# Patient Record
Sex: Female | Born: 1980 | Race: White | Hispanic: No | Marital: Married | Smoking: Never smoker
Health system: Southern US, Community
[De-identification: ages and names within clinical notes are randomized; demographics above are authoritative.]

## PROBLEM LIST (undated history)

## (undated) DIAGNOSIS — Z789 Other specified health status: Secondary | ICD-10-CM

## (undated) DIAGNOSIS — E041 Nontoxic single thyroid nodule: Secondary | ICD-10-CM

## (undated) HISTORY — PX: DENTAL SURGERY: SHX609

## (undated) HISTORY — PX: NO PAST SURGERIES: SHX2092

## (undated) HISTORY — PX: BIOPSY THYROID: PRO38

## (undated) HISTORY — PX: WISDOM TOOTH EXTRACTION: SHX21

## (undated) HISTORY — DX: Nontoxic single thyroid nodule: E04.1

---

## 2009-03-27 ENCOUNTER — Inpatient Hospital Stay (HOSPITAL_COMMUNITY): Admission: AD | Admit: 2009-03-27 | Discharge: 2009-03-30 | Payer: Self-pay | Admitting: Obstetrics and Gynecology

## 2010-03-27 ENCOUNTER — Emergency Department (HOSPITAL_COMMUNITY): Admission: EM | Admit: 2010-03-27 | Discharge: 2010-03-27 | Payer: Self-pay | Admitting: Emergency Medicine

## 2010-09-24 LAB — CBC
HCT: 25.8 % — ABNORMAL LOW (ref 36.0–46.0)
HCT: 32.8 % — ABNORMAL LOW (ref 36.0–46.0)
Hemoglobin: 11.3 g/dL — ABNORMAL LOW (ref 12.0–15.0)
Hemoglobin: 8.9 g/dL — ABNORMAL LOW (ref 12.0–15.0)
MCHC: 34.4 g/dL (ref 30.0–36.0)
MCHC: 34.4 g/dL (ref 30.0–36.0)
MCV: 88.3 fL (ref 78.0–100.0)
MCV: 89.7 fL (ref 78.0–100.0)
Platelets: 145 10*3/uL — ABNORMAL LOW (ref 150–400)
Platelets: 190 10*3/uL (ref 150–400)
RBC: 2.88 MIL/uL — ABNORMAL LOW (ref 3.87–5.11)
RBC: 3.72 MIL/uL — ABNORMAL LOW (ref 3.87–5.11)
RDW: 14.3 % (ref 11.5–15.5)
RDW: 14.3 % (ref 11.5–15.5)
WBC: 15.2 10*3/uL — ABNORMAL HIGH (ref 4.0–10.5)
WBC: 8.4 10*3/uL (ref 4.0–10.5)

## 2010-09-24 LAB — RPR: RPR Ser Ql: NONREACTIVE

## 2010-09-24 LAB — CCBB MATERNAL DONOR DRAW

## 2012-03-09 LAB — OB RESULTS CONSOLE HIV ANTIBODY (ROUTINE TESTING): HIV: NONREACTIVE

## 2012-03-09 LAB — OB RESULTS CONSOLE ABO/RH: RH Type: NEGATIVE

## 2012-03-09 LAB — OB RESULTS CONSOLE ANTIBODY SCREEN: Antibody Screen: NEGATIVE

## 2012-03-09 LAB — OB RESULTS CONSOLE GC/CHLAMYDIA: Gonorrhea: NEGATIVE

## 2012-03-09 LAB — OB RESULTS CONSOLE RPR: RPR: NONREACTIVE

## 2012-03-09 LAB — OB RESULTS CONSOLE HEPATITIS B SURFACE ANTIGEN: Hepatitis B Surface Ag: NEGATIVE

## 2012-06-21 NOTE — L&D Delivery Note (Signed)
After admission the pt rapidly progressed to C/C. She pushed x 2 and had a SVD of one live viable white female infant over an intact perineum. Placenta- S/I. EBL-400cc. Baby to NBN. Left labial tear closed with 3-0 Chromic.

## 2012-10-19 ENCOUNTER — Inpatient Hospital Stay (HOSPITAL_COMMUNITY)
Admission: AD | Admit: 2012-10-19 | Discharge: 2012-10-20 | DRG: 775 | Disposition: A | Discharge status: Home / Self Care | Payer: Managed Care, Other (non HMO) | Source: Ambulatory Visit | Attending: Obstetrics and Gynecology | Admitting: Obstetrics and Gynecology

## 2012-10-19 ENCOUNTER — Inpatient Hospital Stay (HOSPITAL_COMMUNITY): Payer: Managed Care, Other (non HMO) | Admitting: Anesthesiology

## 2012-10-19 ENCOUNTER — Encounter (HOSPITAL_COMMUNITY): Payer: Self-pay | Admitting: *Deleted

## 2012-10-19 ENCOUNTER — Encounter (HOSPITAL_COMMUNITY): Payer: Self-pay | Admitting: Anesthesiology

## 2012-10-19 HISTORY — DX: Other specified health status: Z78.9

## 2012-10-19 LAB — CBC
Hemoglobin: 12.5 g/dL (ref 12.0–15.0)
MCH: 30.9 pg (ref 26.0–34.0)
MCHC: 34.3 g/dL (ref 30.0–36.0)
MCV: 89.9 fL (ref 78.0–100.0)
Platelets: 176 10*3/uL (ref 150–400)
RBC: 4.05 MIL/uL (ref 3.87–5.11)
RDW: 13.9 % (ref 11.5–15.5)
WBC: 12.8 10*3/uL — ABNORMAL HIGH (ref 4.0–10.5)

## 2012-10-19 LAB — ABO/RH: ABO/RH(D): A NEG

## 2012-10-19 LAB — RPR: RPR Ser Ql: NONREACTIVE

## 2012-10-19 MED ORDER — DIBUCAINE 1 % RE OINT: 1.0000 "application " | TOPICAL_OINTMENT | RECTAL | Status: DC | PRN

## 2012-10-19 MED ORDER — ACETAMINOPHEN 325 MG PO TABS: 650.0000 mg | ORAL_TABLET | ORAL | Status: DC | PRN

## 2012-10-19 MED ORDER — FENTANYL 2.5 MCG/ML BUPIVACAINE 1/10 % EPIDURAL INFUSION (WH - ANES)
INTRAMUSCULAR | Status: DC | PRN
  Administered 2012-10-19: 14 mL/h via EPIDURAL

## 2012-10-19 MED ORDER — IBUPROFEN 600 MG PO TABS
600.0000 mg | ORAL_TABLET | Freq: Four times a day (QID) | ORAL | Status: DC | PRN
  Filled 2012-10-19 (×3): qty 1

## 2012-10-19 MED ORDER — PRENATAL MULTIVITAMIN CH
1.0000 | ORAL_TABLET | Freq: Every day | ORAL | Status: DC
  Administered 2012-10-19 – 2012-10-20 (×2): 1 via ORAL
  Filled 2012-10-19 (×2): qty 1

## 2012-10-19 MED ORDER — ONDANSETRON HCL 4 MG/2ML IJ SOLN: 4.0000 mg | INTRAMUSCULAR | Status: DC | PRN

## 2012-10-19 MED ORDER — SIMETHICONE 80 MG PO CHEW: 80.0000 mg | CHEWABLE_TABLET | ORAL | Status: DC | PRN

## 2012-10-19 MED ORDER — WITCH HAZEL-GLYCERIN EX PADS: 1.0000 "application " | MEDICATED_PAD | CUTANEOUS | Status: DC | PRN

## 2012-10-19 MED ORDER — ZOLPIDEM TARTRATE 5 MG PO TABS: 5.0000 mg | ORAL_TABLET | Freq: Every evening | ORAL | Status: DC | PRN

## 2012-10-19 MED ORDER — EPHEDRINE 5 MG/ML INJ
10.0000 mg | INTRAVENOUS | Status: DC | PRN
  Filled 2012-10-19: qty 4
  Filled 2012-10-19: qty 2

## 2012-10-19 MED ORDER — FLEET ENEMA 7-19 GM/118ML RE ENEM: 1.0000 | ENEMA | Freq: Once | RECTAL | Status: DC

## 2012-10-19 MED ORDER — LIDOCAINE HCL (PF) 1 % IJ SOLN
30.0000 mL | INTRAMUSCULAR | Status: DC | PRN
  Filled 2012-10-19 (×2): qty 30

## 2012-10-19 MED ORDER — OXYTOCIN 40 UNITS IN LACTATED RINGERS INFUSION - SIMPLE MED
62.5000 mL/h | INTRAVENOUS | Status: DC
  Administered 2012-10-19: 62.5 mL/h via INTRAVENOUS
  Filled 2012-10-19: qty 1000

## 2012-10-19 MED ORDER — TETANUS-DIPHTH-ACELL PERTUSSIS 5-2.5-18.5 LF-MCG/0.5 IM SUSP
0.5000 mL | Freq: Once | INTRAMUSCULAR | Status: AC
  Administered 2012-10-20: 0.5 mL via INTRAMUSCULAR

## 2012-10-19 MED ORDER — BENZOCAINE-MENTHOL 20-0.5 % EX AERO
1.0000 "application " | INHALATION_SPRAY | CUTANEOUS | Status: DC | PRN
  Administered 2012-10-19: 1 via TOPICAL
  Filled 2012-10-19: qty 56

## 2012-10-19 MED ORDER — ONDANSETRON HCL 4 MG PO TABS: 4.0000 mg | ORAL_TABLET | ORAL | Status: DC | PRN

## 2012-10-19 MED ORDER — OXYCODONE-ACETAMINOPHEN 5-325 MG PO TABS: 1.0000 | ORAL_TABLET | ORAL | Status: DC | PRN

## 2012-10-19 MED ORDER — DIPHENHYDRAMINE HCL 50 MG/ML IJ SOLN: 12.5000 mg | INTRAMUSCULAR | Status: DC | PRN

## 2012-10-19 MED ORDER — LACTATED RINGERS IV SOLN
INTRAVENOUS | Status: DC
  Administered 2012-10-19: 08:00:00 via INTRAVENOUS

## 2012-10-19 MED ORDER — FENTANYL 2.5 MCG/ML BUPIVACAINE 1/10 % EPIDURAL INFUSION (WH - ANES)
14.0000 mL/h | INTRAMUSCULAR | Status: DC | PRN
  Filled 2012-10-19: qty 125

## 2012-10-19 MED ORDER — LIDOCAINE HCL (PF) 1 % IJ SOLN
INTRAMUSCULAR | Status: DC | PRN
  Administered 2012-10-19: 4 mL
  Administered 2012-10-19: 5 mL
  Administered 2012-10-19: 4 mL
  Administered 2012-10-19: 5 mL

## 2012-10-19 MED ORDER — LACTATED RINGERS IV SOLN: 500.0000 mL | Freq: Once | INTRAVENOUS | Status: DC

## 2012-10-19 MED ORDER — EPHEDRINE 5 MG/ML INJ
10.0000 mg | INTRAVENOUS | Status: DC | PRN
  Filled 2012-10-19: qty 2

## 2012-10-19 MED ORDER — PHENYLEPHRINE 40 MCG/ML (10ML) SYRINGE FOR IV PUSH (FOR BLOOD PRESSURE SUPPORT)
80.0000 ug | PREFILLED_SYRINGE | INTRAVENOUS | Status: DC | PRN
  Filled 2012-10-19: qty 5
  Filled 2012-10-19: qty 2

## 2012-10-19 MED ORDER — PHENYLEPHRINE 40 MCG/ML (10ML) SYRINGE FOR IV PUSH (FOR BLOOD PRESSURE SUPPORT)
80.0000 ug | PREFILLED_SYRINGE | INTRAVENOUS | Status: DC | PRN
  Filled 2012-10-19: qty 2

## 2012-10-19 MED ORDER — MEASLES, MUMPS & RUBELLA VAC ~~LOC~~ INJ
0.5000 mL | INJECTION | Freq: Once | SUBCUTANEOUS | Status: DC
  Filled 2012-10-19: qty 0.5

## 2012-10-19 MED ORDER — CITRIC ACID-SODIUM CITRATE 334-500 MG/5ML PO SOLN: 30.0000 mL | ORAL | Status: DC | PRN

## 2012-10-19 MED ORDER — OXYTOCIN BOLUS FROM INFUSION: 500.0000 mL | INTRAVENOUS | Status: DC

## 2012-10-19 MED ORDER — IBUPROFEN 600 MG PO TABS
600.0000 mg | ORAL_TABLET | Freq: Four times a day (QID) | ORAL | Status: DC
  Administered 2012-10-19 – 2012-10-20 (×5): 600 mg via ORAL
  Filled 2012-10-19 (×2): qty 1

## 2012-10-19 MED ORDER — ONDANSETRON HCL 4 MG/2ML IJ SOLN: 4.0000 mg | Freq: Four times a day (QID) | INTRAMUSCULAR | Status: DC | PRN

## 2012-10-19 MED ORDER — LACTATED RINGERS IV SOLN: 500.0000 mL | INTRAVENOUS | Status: DC | PRN

## 2012-10-19 NOTE — H&P (Signed)
Pt is a 31 year old white female, G2P1001 at term who presents to the ER with contractions. On admission the pt's cx was 90/4/-2 vtx. PNC was uncomplicated. GBS- RH-. PMHX: see [facility] PE: VSSAF        HEENT- wnl        ABD- gravid, non tender        FHTS- reactive IMP/ IUP in labor PLAN/ admit

## 2012-10-19 NOTE — Anesthesia Postprocedure Evaluation (Signed)
  Anesthesia Post-op Note  Patient: Shari Farmer  Procedure(s) Performed: * No procedures listed *  Patient Location: PACU and Mother/Baby  Anesthesia Type:Epidural  Level of Consciousness: awake, alert  and oriented  Airway and Oxygen Therapy: Patient Spontanous Breathing  Post-op Pain: mild  Post-op Assessment: Patient's Cardiovascular Status Stable, Respiratory Function Stable, No signs of Nausea or vomiting, Adequate PO intake, Pain level controlled, No headache, No backache, No residual numbness and No residual motor weakness  Post-op Vital Signs: stable  Complications: No apparent anesthesia complications

## 2012-10-19 NOTE — Anesthesia Preprocedure Evaluation (Signed)
Anesthesia Evaluation  Patient identified by MRN, date of birth, ID band Patient awake    Reviewed: Allergy & Precautions, H&P , Patient's Chart, lab work & pertinent test results  Airway Mallampati: III TM Distance: >3 FB Neck ROM: Full    Dental no notable dental hx. (+) Teeth Intact   Pulmonary neg pulmonary ROS,  breath sounds clear to auscultation  Pulmonary exam normal       Cardiovascular negative cardio ROS  Rhythm:Regular Rate:Normal     Neuro/Psych negative neurological ROS  negative psych ROS   GI/Hepatic negative GI ROS, Neg liver ROS,   Endo/Other  negative endocrine ROS  Renal/GU negative Renal ROS  negative genitourinary   Musculoskeletal negative musculoskeletal ROS (+)   Abdominal (+) + obese,   Peds  Hematology negative hematology ROS (+)   Anesthesia Other Findings   Reproductive/Obstetrics (+) Pregnancy                           Anesthesia Physical Anesthesia Plan  ASA: II  Anesthesia Plan: Epidural   Post-op Pain Management:    Induction:   Airway Management Planned: Natural Airway  Additional Equipment:   Intra-op Plan:   Post-operative Plan:   Informed Consent: I have reviewed the patients History and Physical, chart, labs and discussed the procedure including the risks, benefits and alternatives for the proposed anesthesia with the patient or authorized representative who has indicated his/her understanding and acceptance.     Plan Discussed with: Anesthesiologist  Anesthesia Plan Comments:         Anesthesia Quick Evaluation

## 2012-10-19 NOTE — MAU Note (Signed)
Dr Almetta Lovely notified of pt status and states he is on the way in and willl not admit her until he comes in  And see her

## 2012-10-19 NOTE — Anesthesia Procedure Notes (Addendum)
Epidural Patient location during procedure: OB Start time: 10/19/2012 6:06 AM  Staffing Anesthesiologist: FOSTER, MICHAEL A. Performed by: anesthesiologist   Preanesthetic Checklist Completed: patient identified, site marked, surgical consent, pre-op evaluation, timeout performed, IV checked, risks and benefits discussed and monitors and equipment checked  Epidural Patient position: sitting Prep: site prepped and draped and DuraPrep Patient monitoring: continuous pulse ox and blood pressure Approach: midline Injection technique: LOR air  Needle:  Needle type: Tuohy  Needle gauge: 17 G Needle length: 9 cm and 9 Needle insertion depth: 5 cm cm Catheter type: closed end flexible Catheter size: 19 Gauge Catheter at skin depth: 10 cm Test dose: negative and Other  Assessment Events: blood not aspirated, injection not painful, no injection resistance, negative IV test and no paresthesia  Additional Notes Patient identified. Risks and benefits discussed including failed block, incomplete  Pain control, post dural puncture headache, nerve damage, paralysis, blood pressure Changes, nausea, vomiting, reactions to medications-both toxic and allergic and post Partum back pain. All questions were answered. Patient expressed understanding and wished to proceed. Sterile technique was used throughout procedure. Epidural site was Dressed with sterile barrier dressing. No paresthesias, signs of intravascular injection Or signs of intrathecal spread were encountered.  Patient was more comfortable after the epidural was dosed. Please see RN's note for documentation of vital signs and FHR which are stable.   Epidural Patient location during procedure: OB Start time: 10/19/2012 7:52 AM  Staffing Anesthesiologist: Angus Seller., Harrell Gave. Performed by: anesthesiologist   Preanesthetic Checklist Completed: patient identified, site marked, surgical consent, pre-op evaluation, timeout performed,  IV checked, risks and benefits discussed and monitors and equipment checked  Epidural Patient position: sitting Prep: site prepped and draped and DuraPrep Patient monitoring: continuous pulse ox and blood pressure Approach: midline Injection technique: LOR air and LOR saline  Needle:  Needle type: Tuohy  Needle gauge: 17 G Needle length: 9 cm and 9 Needle insertion depth: 7 cm Catheter type: closed end flexible Catheter size: 19 Gauge Catheter at skin depth: 14 cm Test dose: negative  Assessment Events: blood not aspirated, injection not painful, no injection resistance, negative IV test and no paresthesia  Additional Notes Patient identified.  Risk benefits discussed including failed block, incomplete pain control, headache, nerve damage, paralysis, blood pressure changes, nausea, vomiting, reactions to medication both toxic or allergic, and postpartum back pain.  Patient expressed understanding and wished to proceed.  All questions were answered.  Sterile technique used throughout procedure and epidural site dressed with sterile barrier dressing. No paresthesia or other complications noted.The patient did not experience any signs of intravascular injection such as tinnitus or metallic taste in mouth nor signs of intrathecal spread such as rapid motor block. Please see nursing notes for vital signs.

## 2012-10-20 ENCOUNTER — Encounter (HOSPITAL_COMMUNITY): Payer: Self-pay

## 2012-10-20 LAB — CBC
HCT: 34.1 % — ABNORMAL LOW (ref 36.0–46.0)
MCHC: 32.8 g/dL (ref 30.0–36.0)
Platelets: 157 10*3/uL (ref 150–400)
RDW: 14.4 % (ref 11.5–15.5)
WBC: 14.3 10*3/uL — ABNORMAL HIGH (ref 4.0–10.5)

## 2012-10-20 MED ORDER — DOCUSATE SODIUM 100 MG PO CAPS: 100.0000 mg | ORAL_CAPSULE | Freq: Two times a day (BID) | ORAL | Status: DC

## 2012-10-20 MED ORDER — OXYCODONE-ACETAMINOPHEN 5-325 MG PO TABS: 2.0000 | ORAL_TABLET | ORAL | Status: DC | PRN

## 2012-10-20 MED ORDER — IBUPROFEN 600 MG PO TABS
600.0000 mg | ORAL_TABLET | Freq: Four times a day (QID) | ORAL | Status: DC | PRN
Start: 2012-10-20 — End: 2015-02-04

## 2012-10-20 NOTE — Progress Notes (Signed)
UR chart review completed.  

## 2012-10-20 NOTE — Discharge Summary (Signed)
Obstetric Discharge Summary Reason for Admission: onset of labor Prenatal Procedures: ultrasound Intrapartum Procedures: spontaneous vaginal delivery Postpartum Procedures: none Complications-Operative and Postpartum: labial laceration Hemoglobin  Date Value Range Status  10/20/2012 11.2* 12.0 - 15.0 g/dL Final     HCT  Date Value Range Status  10/20/2012 34.1* 36.0 - 46.0 % Final    Physical Exam:  General: alert, cooperative and appears stated age 32: appropriate Uterine Fundus: firm  Discharge Diagnoses: Term Pregnancy-delivered  Discharge Information: Date: 10/20/2012 Activity: pelvic rest Diet: routine Medications: Ibuprofen, Colace and Percocet Condition: improved Instructions: refer to practice specific booklet Discharge to: home Follow-up Information   Follow up with Levi Aland, MD In 4 weeks. (For a postpartum evaluation)    Contact information:   [address] [place] [address] [phone]       Newborn Data: Live born female  Birth Weight: 8 lb 11.9 oz (3965 g) APGAR: 9, 9  Home with mother.  Shari Farmer H. 10/20/2012, 9:43 AM

## 2012-10-20 NOTE — Progress Notes (Signed)
Patient states she has had the Tdap vaccine but I could not find it in the prenatal records.

## 2014-04-22 ENCOUNTER — Encounter (HOSPITAL_COMMUNITY): Payer: Self-pay

## 2015-02-04 ENCOUNTER — Ambulatory Visit (INDEPENDENT_AMBULATORY_CARE_PROVIDER_SITE_OTHER): Payer: Managed Care, Other (non HMO) | Admitting: Emergency Medicine

## 2015-02-04 VITALS — BP 118/72 | HR 87 | Temp 98.4°F | Resp 16 | Ht 63.0 in | Wt 185.8 lb

## 2015-02-04 DIAGNOSIS — Z0000 Encounter for general adult medical examination without abnormal findings: Secondary | ICD-10-CM

## 2015-02-04 LAB — COMPLETE METABOLIC PANEL WITH GFR
ALT: 11 U/L (ref 6–29)
AST: 14 U/L (ref 10–30)
Albumin: 4.1 g/dL (ref 3.6–5.1)
Alkaline Phosphatase: 31 U/L — ABNORMAL LOW (ref 33–115)
BILIRUBIN TOTAL: 0.8 mg/dL (ref 0.2–1.2)
BUN: 11 mg/dL (ref 7–25)
CALCIUM: 9.7 mg/dL (ref 8.6–10.2)
CO2: 29 mmol/L (ref 20–31)
CREATININE: 0.76 mg/dL (ref 0.50–1.10)
Chloride: 102 mmol/L (ref 98–110)
GFR, Est Non African American: >89 mL/min (ref >=60)
Glucose, Bld: 84 mg/dL (ref 65–99)
Potassium: 4.6 mmol/L (ref 3.5–5.3)
Sodium: 139 mmol/L (ref 135–146)
TOTAL PROTEIN: 7 g/dL (ref 6.1–8.1)

## 2015-02-04 NOTE — Progress Notes (Signed)

## 2015-02-04 NOTE — Progress Notes (Signed)
Subjective:  Patient ID: Shari Farmer, female    DOB: 1980-10-09  Age: 34 y.o. MRN: [id number]  CC: Annual Exam   HPI Yuliya Nova presents  for a physical and TB test she takes no medication has no current medical problems  History Emerlyn has a past medical history of Medical history non-contributory.   She has past surgical history that includes No past surgeries.   Her  family history includes Cancer in her maternal grandfather, maternal grandmother, mother, and paternal grandmother; Diabetes in her father, paternal grandfather, and paternal grandmother; Heart disease in her paternal grandmother; Hyperlipidemia in her father, mother, and paternal grandmother; Hypertension in her paternal grandfather.  She   reports that she has never smoked. She has never used smokeless tobacco. She reports that she does not drink alcohol or use illicit drugs.  Outpatient Prescriptions Prior to Visit  Medication Sig Dispense Refill  . docusate sodium (COLACE) 100 MG capsule Take 1 capsule (100 mg total) by mouth 2 (two) times daily. 60 capsule 0  . ibuprofen (ADVIL,MOTRIN) 600 MG tablet Take 1 tablet (600 mg total) by mouth every 6 (six) hours as needed for pain. 90 tablet 0  . oxyCODONE-acetaminophen (ROXICET) 5-325 MG per tablet Take 2 tablets by mouth every 4 (four) hours as needed for pain. May take 1-2 tablets every 4-6 hours as needed for pain 30 tablet 0  . Prenatal Vit-Fe Fumarate-FA (PRENATAL MULTIVITAMIN) TABS Take 1 tablet by mouth daily at 12 noon.     No facility-administered medications prior to visit.    Social History   Social History  . Marital Status: Married    Spouse Name: N/A  . Number of Children: N/A  . Years of Education: N/A   Social History Main Topics  . Smoking status: Never Smoker   . Smokeless tobacco: Never Used  . Alcohol Use: No  . Drug Use: No  . Sexual Activity: Yes    Birth Control/ Protection: None   Other Topics Concern  . None    Social History Narrative     Review of Systems  Constitutional: Negative for fever, chills and appetite change.  HENT: Negative for congestion, ear pain, postnasal drip, sinus pressure and sore throat.   Eyes: Negative for pain and redness.  Respiratory: Negative for cough, shortness of breath and wheezing.   Cardiovascular: Negative for leg swelling.  Gastrointestinal: Negative for nausea, vomiting, abdominal pain, diarrhea, constipation and blood in stool.  Endocrine: Negative for polyuria.  Genitourinary: Negative for dysuria, urgency, frequency and flank pain.  Musculoskeletal: Negative for gait problem.  Skin: Negative for rash.  Neurological: Negative for weakness and headaches.  Psychiatric/Behavioral: Negative for confusion and decreased concentration. The patient is not nervous/anxious.     Objective:  BP 118/72 mmHg  Pulse 87  Temp(Src) 98.4 F (36.9 C) (Oral)  Resp 16  Ht 5\' 3"  (1.6 m)  Wt 185 lb 12.8 oz (84.278 kg)  BMI 32.92 kg/m2  SpO2 99%  LMP 01/06/2015  Physical Exam  Constitutional: She is oriented to person, place, and time. She appears well-developed and well-nourished. No distress.  HENT:  Head: Normocephalic and atraumatic.  Right Ear: External ear normal.  Left Ear: External ear normal.  Nose: Nose normal.  Eyes: Conjunctivae and EOM are normal. Pupils are equal, round, and reactive to light. No scleral icterus.  Neck: Normal range of motion. Neck supple. No tracheal deviation present.  Cardiovascular: Normal rate, regular rhythm and normal heart sounds.   Pulmonary/Chest: Effort  normal. No respiratory distress. She has no wheezes. She has no rales.  Abdominal: She exhibits no mass. There is no tenderness. There is no rebound and no guarding.  Musculoskeletal: She exhibits no edema.  Lymphadenopathy:    She has no cervical adenopathy.  Neurological: She is alert and oriented to person, place, and time. Coordination normal.  Skin: Skin is warm  and dry. No rash noted.  Psychiatric: She has a normal mood and affect. Her behavior is normal.      Assessment & Plan:   David was seen today for annual exam.  Diagnoses and all orders for this visit:  Annual physical exam -     TB Skin Test -     COMPLETE METABOLIC PANEL WITH GFR   I have discontinued Ms. Hansley's prenatal multivitamin, ibuprofen, docusate sodium, and oxyCODONE-acetaminophen.  No orders of the defined types were placed in this encounter.    Appropriate red flag conditions were discussed with the patient as well as actions that should be taken.  Patient expressed his understanding.  Follow-up: Return in about 2 days (around 02/06/2015).  Carmelina Dane, MD

## 2015-02-04 NOTE — Patient Instructions (Signed)

## 2015-02-06 ENCOUNTER — Encounter: Payer: Managed Care, Other (non HMO) | Admitting: *Deleted

## 2015-02-06 DIAGNOSIS — Z111 Encounter for screening for respiratory tuberculosis: Secondary | ICD-10-CM

## 2015-02-06 LAB — TB SKIN TEST
INDURATION: 0 mm
TB Skin Test: NEGATIVE

## 2015-02-13 ENCOUNTER — Telehealth: Payer: Self-pay

## 2015-02-13 NOTE — Telephone Encounter (Signed)
Informed pt of lab results  

## 2015-03-03 ENCOUNTER — Ambulatory Visit (INDEPENDENT_AMBULATORY_CARE_PROVIDER_SITE_OTHER): Payer: Managed Care, Other (non HMO)

## 2015-03-03 ENCOUNTER — Ambulatory Visit (INDEPENDENT_AMBULATORY_CARE_PROVIDER_SITE_OTHER): Payer: Managed Care, Other (non HMO) | Admitting: Family Medicine

## 2015-03-03 VITALS — BP 110/70 | HR 92 | Temp 98.0°F | Resp 16 | Ht 66.0 in | Wt 189.0 lb

## 2015-03-03 DIAGNOSIS — M25561 Pain in right knee: Secondary | ICD-10-CM | POA: Diagnosis not present

## 2015-03-03 DIAGNOSIS — M25461 Effusion, right knee: Secondary | ICD-10-CM | POA: Diagnosis not present

## 2015-03-03 DIAGNOSIS — M7040 Prepatellar bursitis, unspecified knee: Secondary | ICD-10-CM | POA: Insufficient documentation

## 2015-03-03 DIAGNOSIS — M7041 Prepatellar bursitis, right knee: Secondary | ICD-10-CM | POA: Diagnosis not present

## 2015-03-03 MED ORDER — MELOXICAM 15 MG PO TABS: 7.5000 mg | ORAL_TABLET | Freq: Every day | ORAL | Status: DC

## 2015-03-03 NOTE — Patient Instructions (Addendum)
Bursitis Bursitis is a swelling and soreness (inflammation) of a fluid-filled sac (bursa) that overlies and protects a joint. It can be caused by injury, overuse of the joint, arthritis or infection. The joints most likely to be affected are the elbows, shoulders, hips and knees. HOME CARE INSTRUCTIONS   Apply ice to the affected area for 15-20 minutes each hour while awake for 2 days. Put the ice in a plastic bag and place a towel between the bag of ice and your skin.  Rest the injured joint as much as possible, but continue to put the joint through a full range of motion, 4 times per day. (The shoulder joint especially becomes rapidly "frozen" if not used.) When the pain lessens, begin normal slow movements and usual activities.  Only take over-the-counter or prescription medicines for pain, discomfort or fever as directed by your caregiver.  Your caregiver may recommend draining the bursa and injecting medicine into the bursa. This may help the healing process.  Follow all instructions for follow-up with your caregiver. This includes any orthopedic referrals, physical therapy and rehabilitation. Any delay in obtaining necessary care could result in a delay or failure of the bursitis to heal and chronic pain. SEEK IMMEDIATE MEDICAL CARE IF:   Your pain increases even during treatment.  You develop an oral temperature above 102 F (38.9 C) and have heat and inflammation over the involved bursa. MAKE SURE YOU:   Understand these instructions.  Will watch your condition.  Will get help right away if you are not doing well or get worse. Document Released: 06/04/2000 Document Revised: 08/30/2011 Document Reviewed: 08/27/2013 [facility] Patient Information 2015 [place], [place]. This information is not intended to replace advice given to you by your health care provider. Make sure you discuss any questions you have with your health care provider.    Knee Pain The knee is the complex joint  between your thigh and your lower leg. It is made up of bones, tendons, ligaments, and cartilage. The bones that make up the knee are:  The femur in the thigh.  The tibia and fibula in the lower leg.  The patella or kneecap riding in the groove on the lower femur. CAUSES  Knee pain is a common complaint with many causes. A few of these causes are:  Injury, such as:  A ruptured ligament or tendon injury.  Torn cartilage.  Medical conditions, such as:  Gout  Arthritis  Infections  Overuse, over training, or overdoing a physical activity. Knee pain can be minor or severe. Knee pain can accompany debilitating injury. Minor knee problems often respond well to self-care measures or get well on their own. More serious injuries may need medical intervention or even surgery. SYMPTOMS The knee is complex. Symptoms of knee problems can vary widely. Some of the problems are:  Pain with movement and weight bearing.  Swelling and tenderness.  Buckling of the knee.  Inability to straighten or extend your knee.  Your knee locks and you cannot straighten it.  Warmth and redness with pain and fever.  Deformity or dislocation of the kneecap. DIAGNOSIS  Determining what is wrong may be very straight forward such as when there is an injury. It can also be challenging because of the complexity of the knee. Tests to make a diagnosis may include:  Your caregiver taking a history and doing a physical exam.  Routine X-rays can be used to rule out other problems. X-rays will not reveal a cartilage tear. Some injuries  of the knee can be diagnosed by:  Arthroscopy a surgical technique by which a small video camera is inserted through tiny incisions on the sides of the knee. This procedure is used to examine and repair internal knee joint problems. Tiny instruments can be used during arthroscopy to repair the torn knee cartilage (meniscus).  Arthrography is a radiology technique. A contrast  liquid is directly injected into the knee joint. Internal structures of the knee joint then become visible on X-ray film.  An MRI scan is a non X-ray radiology procedure in which magnetic fields and a computer produce two- or three-dimensional images of the inside of the knee. Cartilage tears are often visible using an MRI scanner. MRI scans have largely replaced arthrography in diagnosing cartilage tears of the knee.  Blood work.  Examination of the fluid that helps to lubricate the knee joint (synovial fluid). This is done by taking a sample out using a needle and a syringe. TREATMENT The treatment of knee problems depends on the cause. Some of these treatments are:  Depending on the injury, proper casting, splinting, surgery, or physical therapy care will be needed.  Give yourself adequate recovery time. Do not overuse your joints. If you begin to get sore during workout routines, back off. Slow down or do fewer repetitions.  For repetitive activities such as cycling or running, maintain your strength and nutrition.  Alternate muscle groups. For example, if you are a weight lifter, work the upper body on one day and the lower body the next.  Either tight or weak muscles do not give the proper support for your knee. Tight or weak muscles do not absorb the stress placed on the knee joint. Keep the muscles surrounding the knee strong.  Take care of mechanical problems.  If you have flat feet, orthotics or special shoes may help. See your caregiver if you need help.  Arch supports, sometimes with wedges on the inner or outer aspect of the heel, can help. These can shift pressure away from the side of the knee most bothered by osteoarthritis.  A brace called an "unloader" brace also may be used to help ease the pressure on the most arthritic side of the knee.  If your caregiver has prescribed crutches, braces, wraps or ice, use as directed. The acronym for this is PRICE. This means  protection, rest, ice, compression, and elevation.  Nonsteroidal anti-inflammatory drugs (NSAIDs), can help relieve pain. But if taken immediately after an injury, they may actually increase swelling. Take NSAIDs with food in your stomach. Stop them if you develop stomach problems. Do not take these if you have a history of ulcers, stomach pain, or bleeding from the bowel. Do not take without your caregiver's approval if you have problems with fluid retention, heart failure, or kidney problems.  For ongoing knee problems, physical therapy may be helpful.  Glucosamine and chondroitin are over-the-counter dietary supplements. Both may help relieve the pain of osteoarthritis in the knee. These medicines are different from the usual anti-inflammatory drugs. Glucosamine may decrease the rate of cartilage destruction.  Injections of a corticosteroid drug into your knee joint may help reduce the symptoms of an arthritis flare-up. They may provide pain relief that lasts a few months. You may have to wait a few months between injections. The injections do have a small increased risk of infection, water retention, and elevated blood sugar levels.  Hyaluronic acid injected into damaged joints may ease pain and provide lubrication. These injections may work  by reducing inflammation. A series of shots may give relief for as long as 6 months.  Topical painkillers. Applying certain ointments to your skin may help relieve the pain and stiffness of osteoarthritis. Ask your pharmacist for suggestions. Many over the-counter products are approved for temporary relief of arthritis pain.  In some countries, doctors often prescribe topical NSAIDs for relief of chronic conditions such as arthritis and tendinitis. A review of treatment with NSAID creams found that they worked as well as oral medications but without the serious side effects. PREVENTION  Maintain a healthy weight. Extra pounds put more strain on your  joints.  Get strong, stay limber. Weak muscles are a common cause of knee injuries. Stretching is important. Include flexibility exercises in your workouts.  Be smart about exercise. If you have osteoarthritis, chronic knee pain or recurring injuries, you may need to change the way you exercise. This does not mean you have to stop being active. If your knees ache after jogging or playing basketball, consider switching to swimming, water aerobics, or other low-impact activities, at least for a few days a week. Sometimes limiting high-impact activities will provide relief.  Make sure your shoes fit well. Choose footwear that is right for your sport.  Protect your knees. Use the proper gear for knee-sensitive activities. Use kneepads when playing volleyball or laying carpet. Buckle your seat belt every time you drive. Most shattered kneecaps occur in car accidents.  Rest when you are tired. SEEK MEDICAL CARE IF:  You have knee pain that is continual and does not seem to be getting better.  SEEK IMMEDIATE MEDICAL CARE IF:  Your knee joint feels hot to the touch and you have a high fever. MAKE SURE YOU:   Understand these instructions.  Will watch your condition.  Will get help right away if you are not doing well or get worse. Document Released: 04/04/2007 Document Revised: 08/30/2011 Document Reviewed: 04/04/2007 [facility] Patient Information 2015 [place], [place]. This information is not intended to replace advice given to you by your health care provider. Make sure you discuss any questions you have with your health care provider.

## 2015-03-03 NOTE — Progress Notes (Signed)
    MRN: [id number] DOB: 1980-12-08  Subjective:   Shari Farmer is a 34 y.o. female presenting for chief complaint of Knee Pain  Reports 1.5 week history of right knee pain. Pain is sharp and intermittent, feels like a tightness, worse with bending and squatting, does not radiate, associated with swelling in the past few days. Has Tylenol tried with temporary relief. 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 She does have 3 children of her own and was then sent home but believes that the difference is the hard floors at her work. She had one episode of knee pain very similar to this about 5 years ago and resolved with anti-inflammatories. Denies fever, redness, popping, hearing tearing noises, knee buckling, warmth. Denies any other aggravating or relieving factors, no other questions or concerns.  Samon currently has no medications in their medication list. Also is allergic to zithromax.  Skyla  has a past medical history of Medical history non-contributory. Also  has past surgical history that includes No past surgeries.  Objective:   Vitals: BP 110/70 mmHg  Pulse 92  Temp(Src) 98 F (36.7 C) (Oral)  Resp 16  Ht  (1.676 m)  Wt 189 lb (85.73 kg)  BMI 30.52 kg/m2  SpO2 99%  LMP 02/13/2015  Physical Exam  Constitutional: She is oriented to person, place, and time. She appears well-developed and well-nourished.  Cardiovascular: Normal rate.   Pulmonary/Chest: Effort normal.  Musculoskeletal:       Right knee: She exhibits decreased range of motion (on extension (due to pain per patient)) and swelling (trace edema over medial side just inferior to patella). She exhibits no ecchymosis, no deformity, no laceration, no erythema, normal alignment, no LCL laxity, normal patellar mobility and no bony tenderness. No tenderness found.  Neurological: She is alert and oriented to person, place,  and time.  Skin: Skin is warm and dry. No rash noted. No erythema. No pallor.   [facility] reading (PRIMARY) by  Dr. Patsy Lager and PA-Chester Romero. Right knee - normal.  Assessment and Plan :   1. Prepatellar bursitis, right 2. Right knee pain 3. Knee swelling, right - Likely due to overuse, advised rest, anti-inflammatory, ice once after work. Wear knee brace for added support. RTC in 2 weeks if no improvement.  Wallis Bamberg, PA-C [facility] [phone] 03/03/2015 8:26 AM

## 2016-10-07 ENCOUNTER — Other Ambulatory Visit: Payer: Self-pay | Admitting: Internal Medicine

## 2016-10-07 DIAGNOSIS — R490 Dysphonia: Secondary | ICD-10-CM

## 2016-10-07 DIAGNOSIS — R946 Abnormal results of thyroid function studies: Secondary | ICD-10-CM

## 2016-11-01 ENCOUNTER — Ambulatory Visit
Admission: RE | Admit: 2016-11-01 | Discharge: 2016-11-01 | Disposition: A | Discharge status: Home / Self Care | Payer: BLUE CROSS/BLUE SHIELD | Source: Ambulatory Visit | Attending: Internal Medicine | Admitting: Internal Medicine

## 2016-11-01 DIAGNOSIS — R490 Dysphonia: Secondary | ICD-10-CM

## 2016-11-01 DIAGNOSIS — R946 Abnormal results of thyroid function studies: Secondary | ICD-10-CM

## 2016-11-04 ENCOUNTER — Other Ambulatory Visit: Payer: Self-pay | Admitting: Internal Medicine

## 2016-11-04 DIAGNOSIS — E049 Nontoxic goiter, unspecified: Secondary | ICD-10-CM

## 2016-11-11 ENCOUNTER — Other Ambulatory Visit (HOSPITAL_COMMUNITY)
Admission: RE | Admit: 2016-11-11 | Discharge: 2016-11-11 | Disposition: A | Discharge status: Home / Self Care | Payer: BLUE CROSS/BLUE SHIELD | Source: Ambulatory Visit | Attending: Student | Admitting: Student

## 2016-11-11 ENCOUNTER — Ambulatory Visit
Admission: RE | Admit: 2016-11-11 | Discharge: 2016-11-11 | Disposition: A | Discharge status: Home / Self Care | Payer: BLUE CROSS/BLUE SHIELD | Source: Ambulatory Visit | Attending: Internal Medicine | Admitting: Internal Medicine

## 2016-11-11 DIAGNOSIS — E049 Nontoxic goiter, unspecified: Secondary | ICD-10-CM

## 2016-11-11 DIAGNOSIS — E041 Nontoxic single thyroid nodule: Secondary | ICD-10-CM | POA: Insufficient documentation

## 2017-06-18 IMAGING — CR DG KNEE COMPLETE 4+V*R*
4 series · 4 of 4 positions shown · non-contrast
Comparison: None.

CLINICAL DATA: Knee pain.  Initial evaluation.

EXAM:
RIGHT KNEE - COMPLETE 4+ VIEW

[AP]
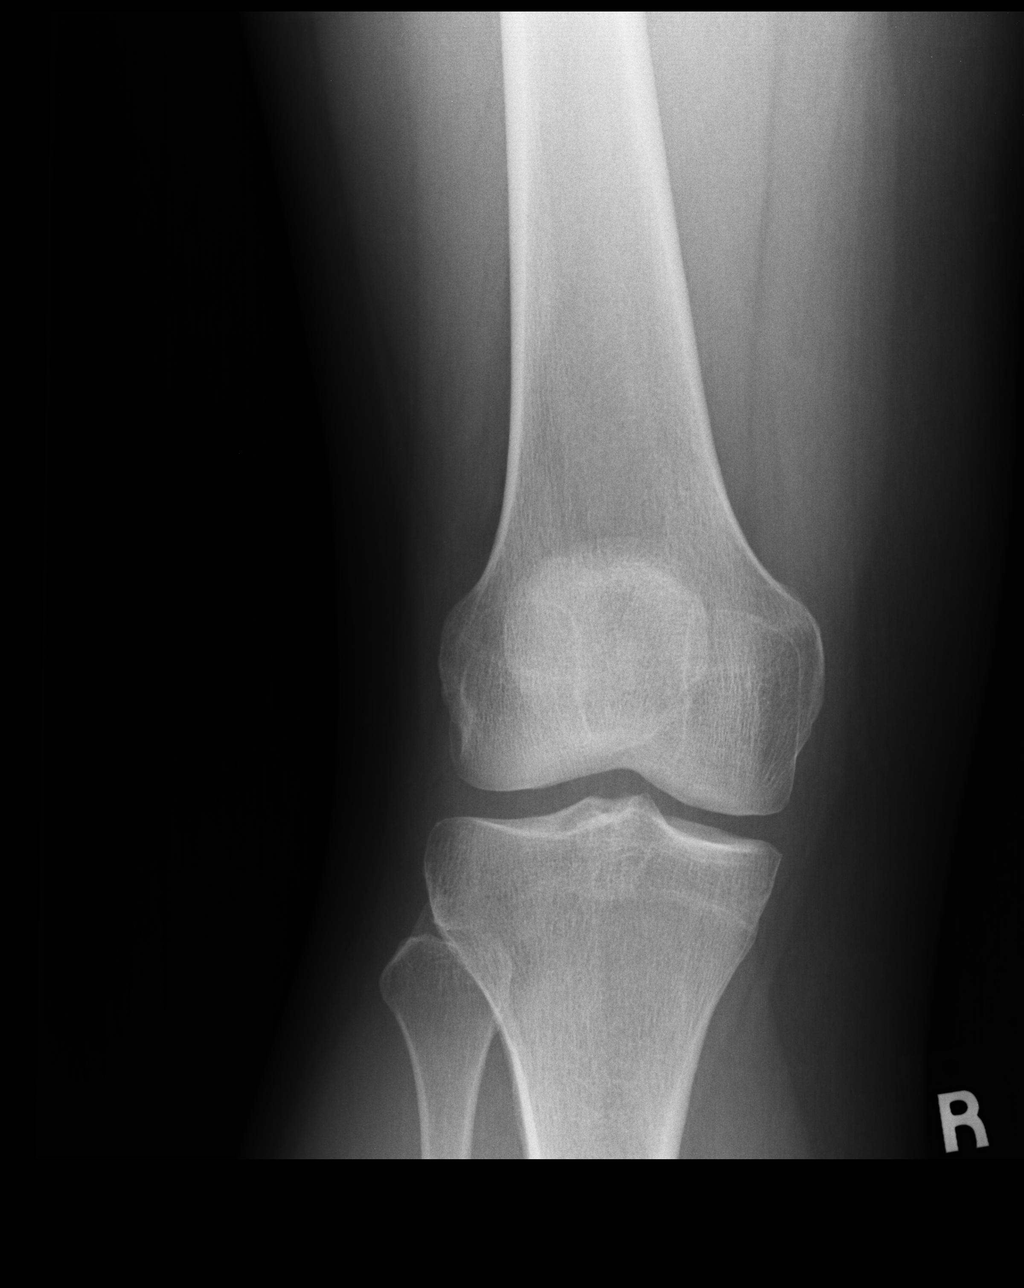

[lateral]
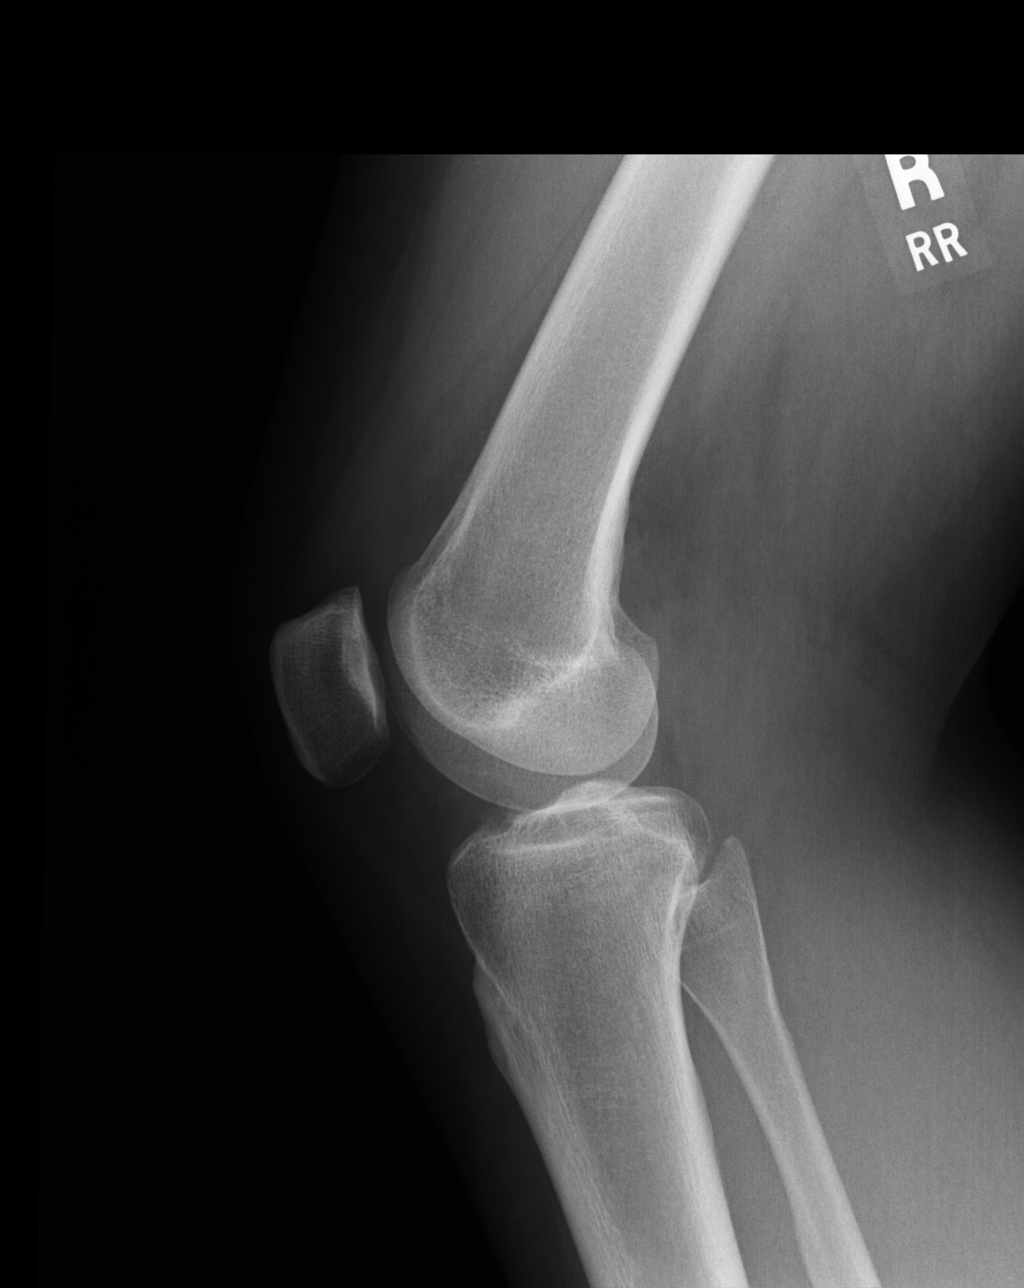

[ap axial]
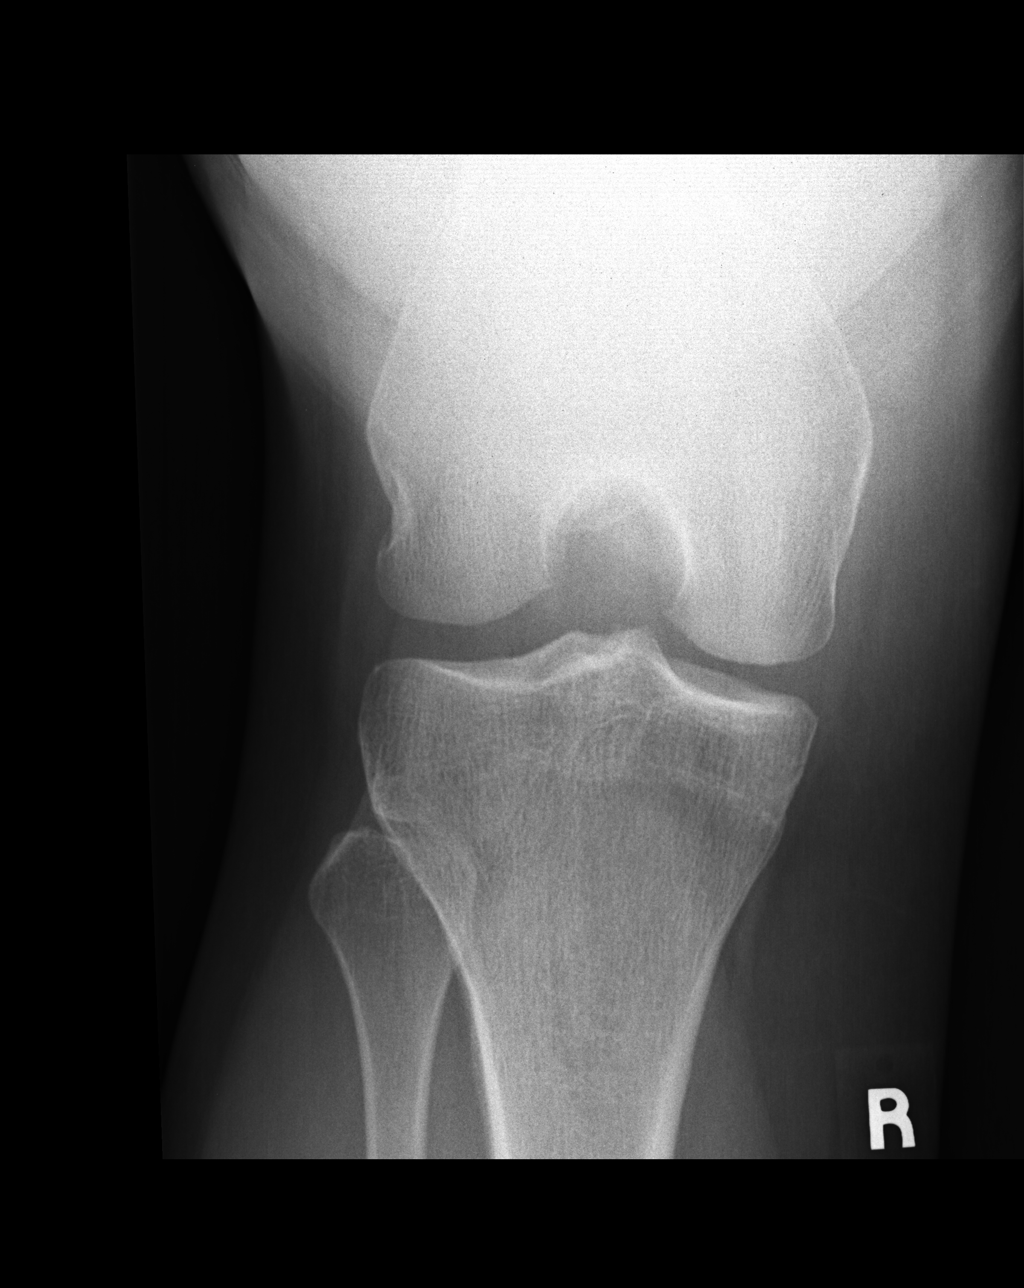

[sunrise]
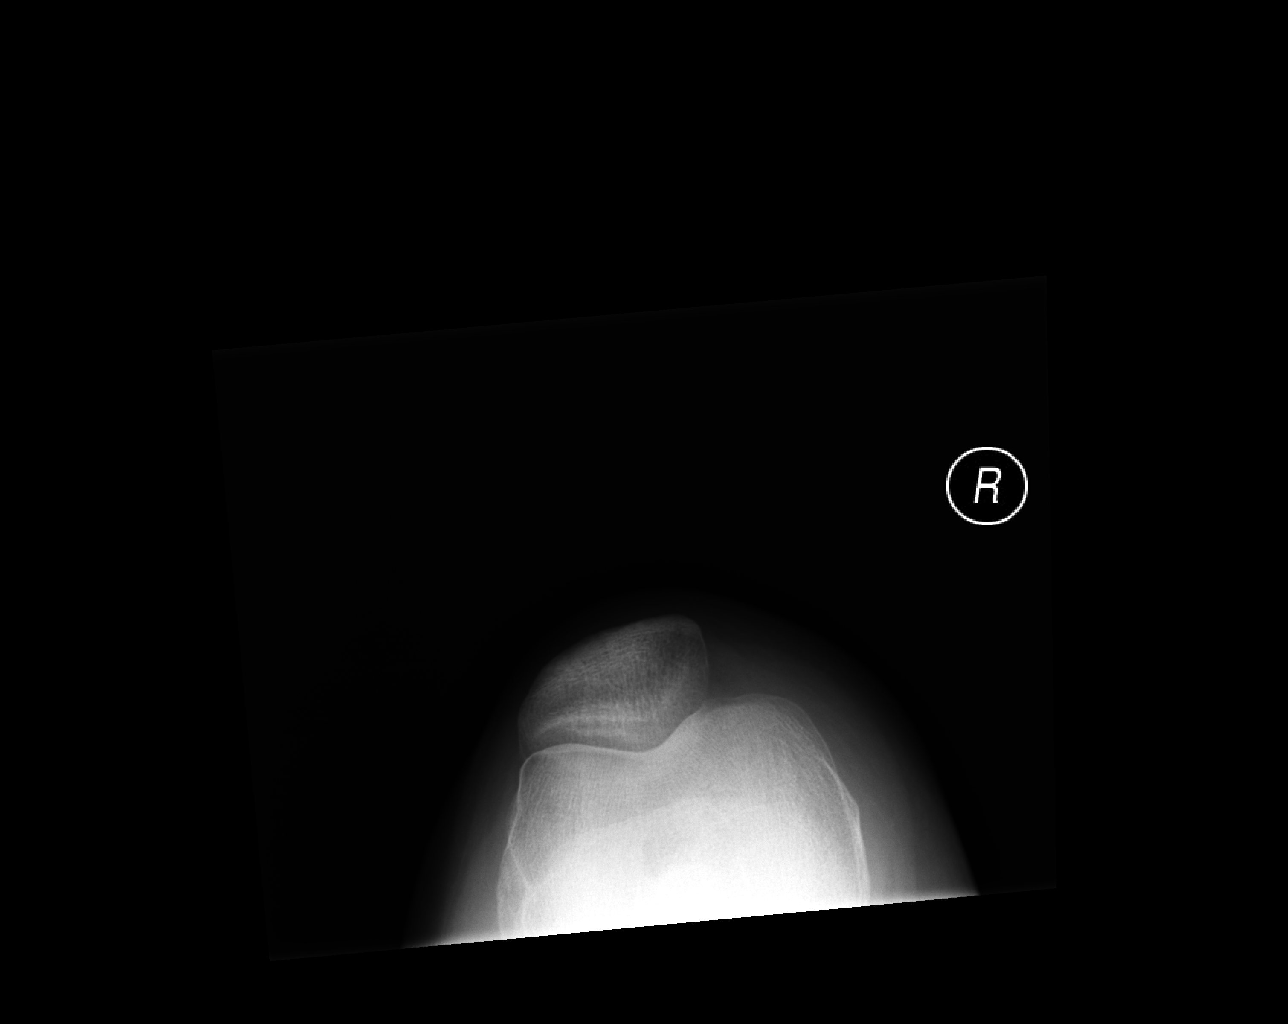

[4 of 4 positions shown; findings below may reference images not displayed]

FINDINGS: No acute bony or joint abnormality identified. No evidence of
fracture or dislocation.
IMPRESSION: No acute or focal abnormality.

## 2017-12-06 ENCOUNTER — Other Ambulatory Visit: Payer: Self-pay | Admitting: Internal Medicine

## 2017-12-06 DIAGNOSIS — E042 Nontoxic multinodular goiter: Secondary | ICD-10-CM

## 2017-12-16 ENCOUNTER — Other Ambulatory Visit: Payer: BLUE CROSS/BLUE SHIELD

## 2017-12-28 ENCOUNTER — Ambulatory Visit
Admission: RE | Admit: 2017-12-28 | Discharge: 2017-12-28 | Disposition: A | Discharge status: Home / Self Care | Payer: Self-pay | Source: Ambulatory Visit | Attending: Internal Medicine | Admitting: Internal Medicine

## 2017-12-28 DIAGNOSIS — E042 Nontoxic multinodular goiter: Secondary | ICD-10-CM

## 2020-05-01 IMAGING — US US THYROID
1 series · 13 of 25 positions shown · non-contrast
Comparison: 11/11/2016, 11/01/2016

CLINICAL DATA: 36-year-old female with a history of thyroid
nodules.

Prior biopsy of the left thyroid nodule 11/11/2016. Pathology report
reports [REDACTED] 2 (benign) results
EXAM:
THYROID ULTRASOUND
TECHNIQUE: Ultrasound examination of the thyroid gland and adjacent soft
tissues was performed.

[Series 1: us thyroid · 0.07mm/px · 13 of 56 slices shown]
[im 1/56]
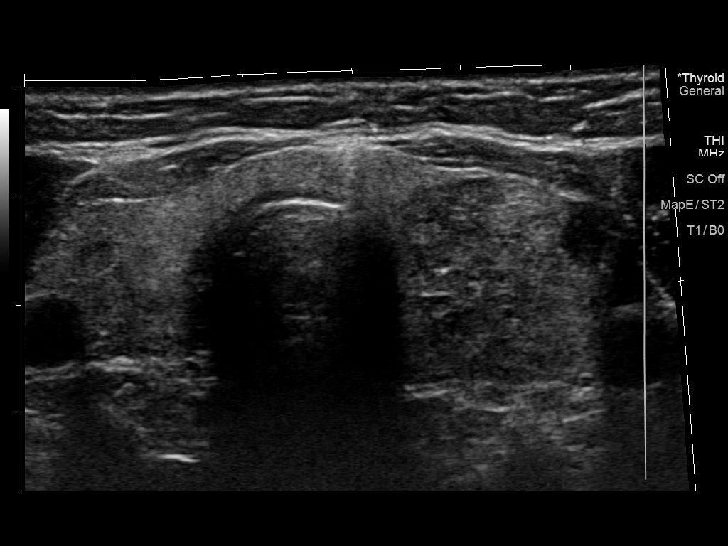
[im 5/56]
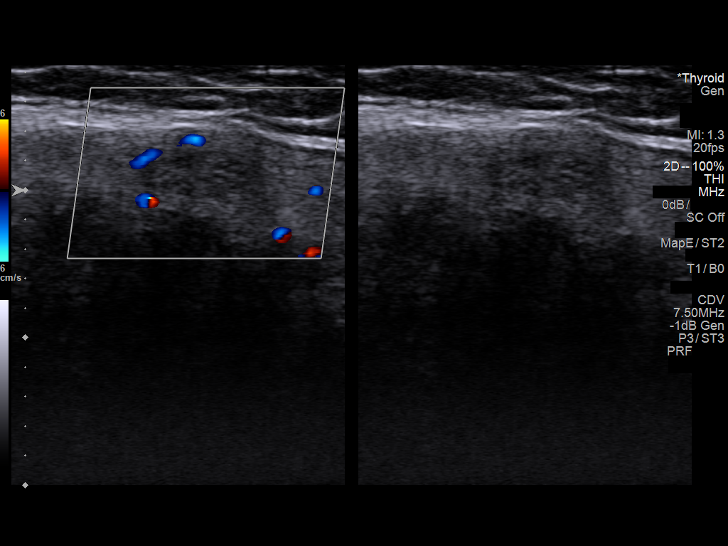
[im 10/56]
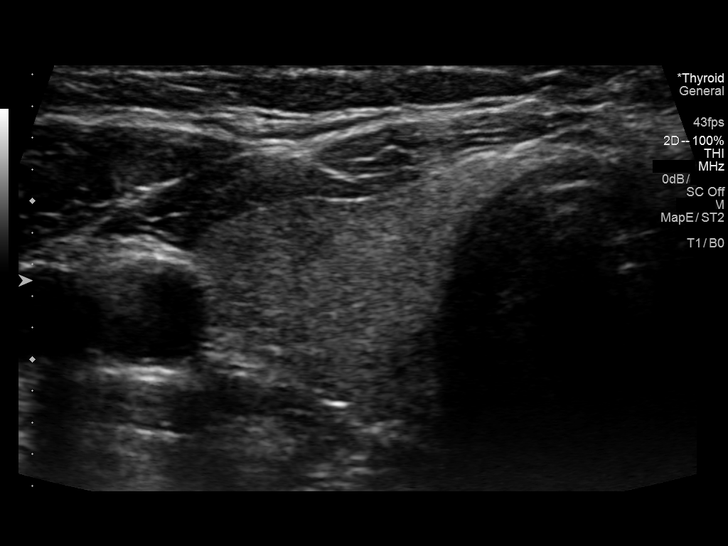
[im 14/56]
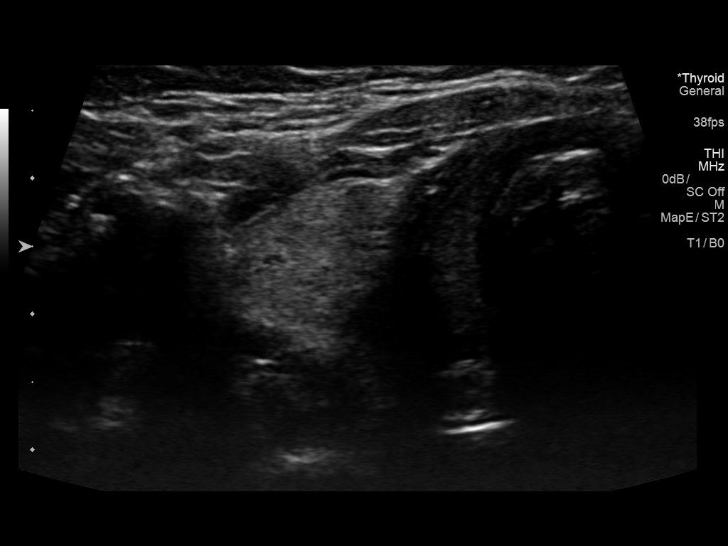
[im 19/56]
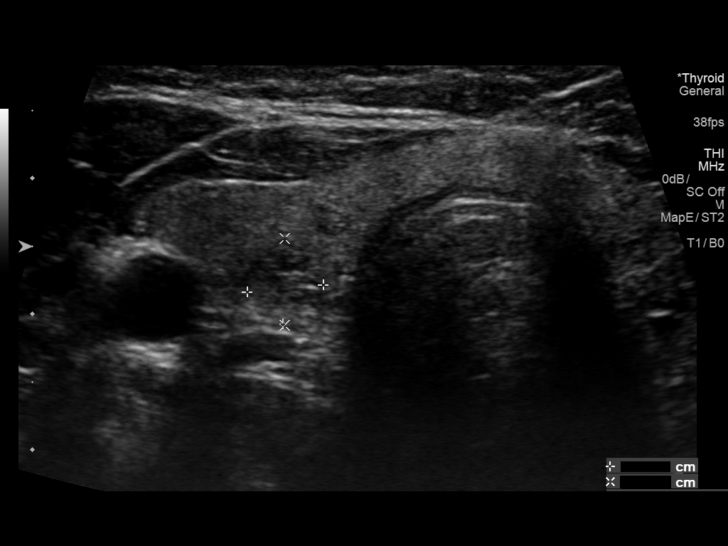
[im 23/56]
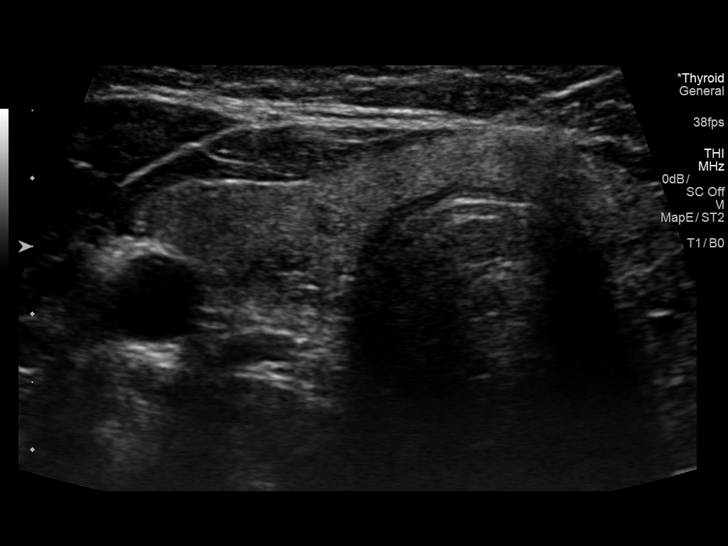
[im 28/56]
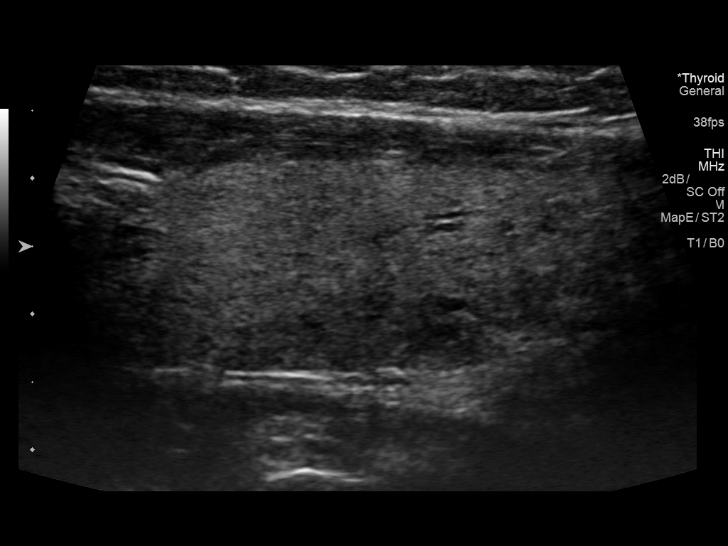
[im 33/56]
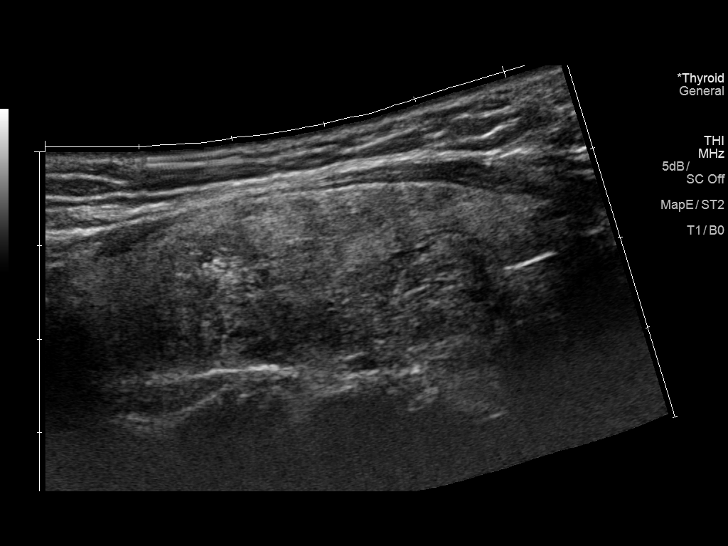
[im 37/56]
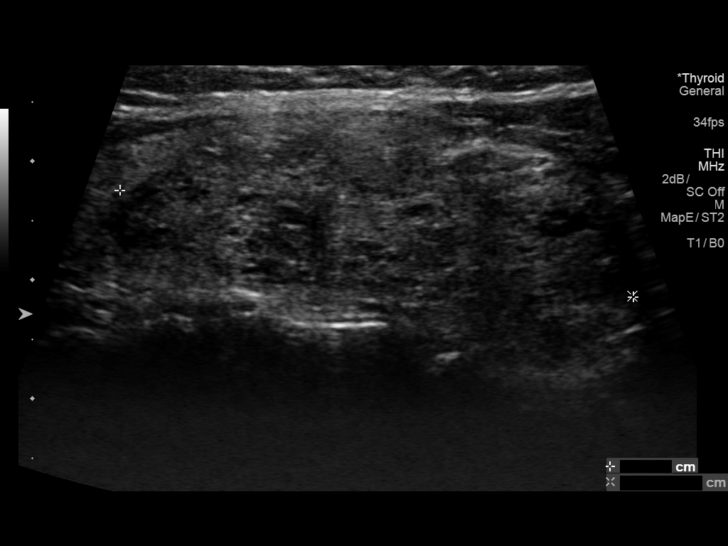
[im 42/56]
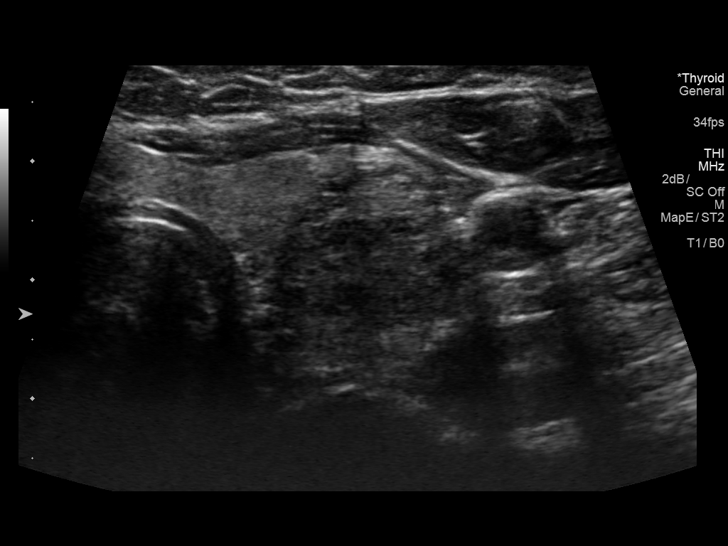
[im 46/56]
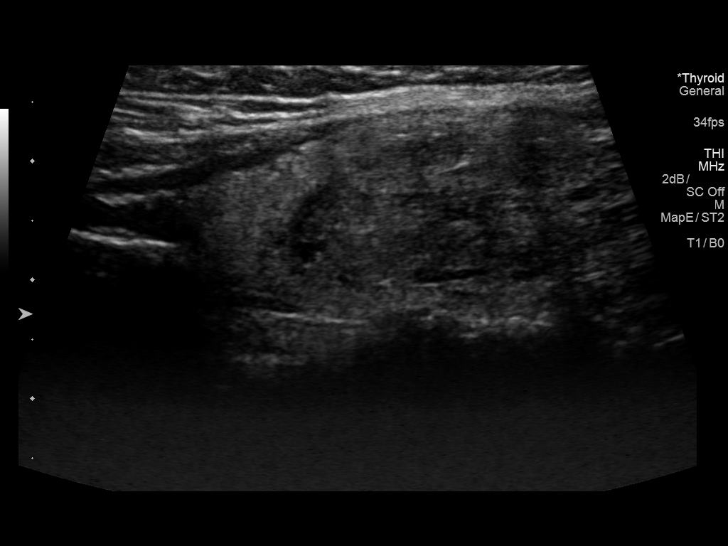
[im 51/56]
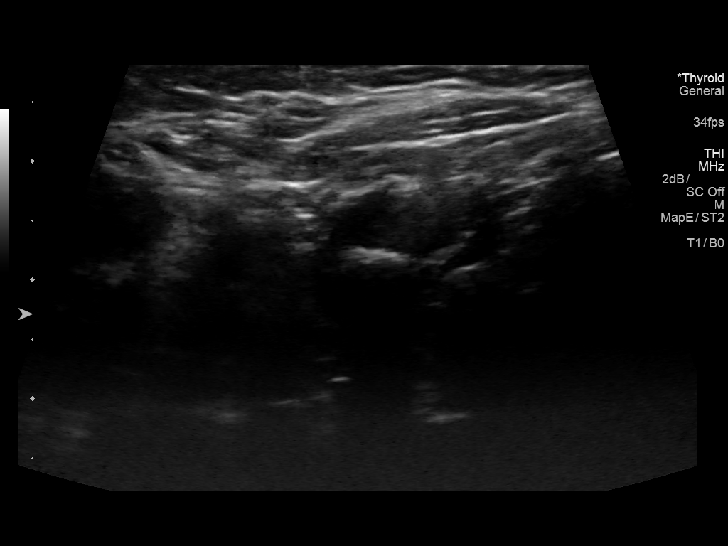
[im 56/56]
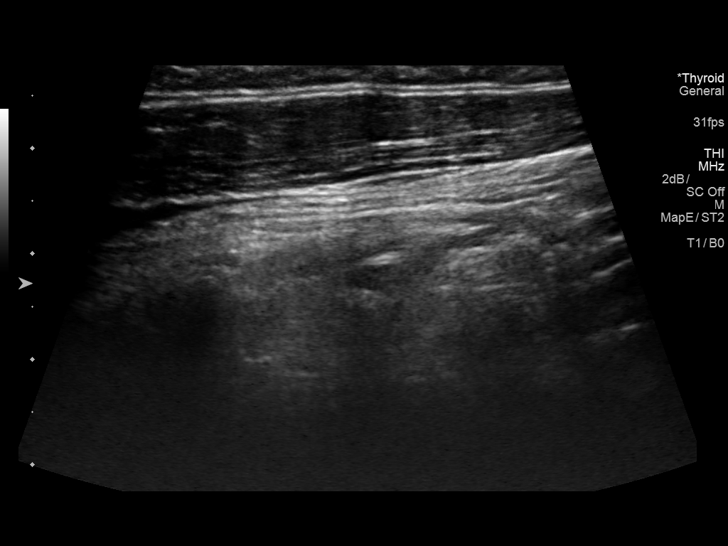

[13 of 25 positions shown; findings below may reference images not displayed]

FINDINGS: Parenchymal Echotexture: Mildly heterogenous

Isthmus: 0.3 cm

Right lobe: 6.1 cm x 1.2 cm x 1.7 cm

Left lobe: 5.7 cm x 1.8 cm x 2.3 cm

_________________________________________________________

Estimated total number of nodules >/= 1 cm: 1

Number of spongiform nodules >/=  2 cm not described below (TR1): 0

Number of mixed cystic and solid nodules >/= 1.5 cm not described
below (TR2): 0

_________________________________________________________

Nodule # 1:

Location: Isthmus; Mid

Maximum size: 0.7 cm; Other 2 dimensions: 0.6 cm x 0.4 cm

Composition: cannot determine (2)

Echogenicity: isoechoic (1)

Shape: not taller-than-wide (0)

Margins: ill-defined (0)

Echogenic foci: none (0)

ACR TI-RADS total points: 3.

ACR TI-RADS risk category: TR3 (3 points).

ACR TI-RADS recommendations:

Nodule does not meet criteria for surveillance or biopsy

_________________________________________________________

Nodule # 2:

Location: Right; Inferior

Maximum size: 0.9 cm; Other 2 dimensions: 0.6 cm x 0.6 cm

Composition: spongiform (0)

ACR TI-RADS recommendations:

Spongiform nodule does not meet criteria for surveillance or biopsy

_________________________________________________________

Left thyroid nodule has been previously biopsied with benign result.

No adenopathy.
IMPRESSION: Left thyroid nodule has been previously biopsied with benign result.
No specific further surveillance is recommended by updated
guidelines.

No other thyroid nodule meets criteria for biopsy or surveillance,
as designated by the newly established ACR TI-RADS criteria.

Recommendations follow those established by the new ACR TI-RADS
criteria ([HOSPITAL] 9383;[DATE]).

## 2023-01-10 ENCOUNTER — Other Ambulatory Visit: Payer: Self-pay | Admitting: Obstetrics and Gynecology

## 2023-01-10 DIAGNOSIS — R928 Other abnormal and inconclusive findings on diagnostic imaging of breast: Secondary | ICD-10-CM

## 2023-01-24 ENCOUNTER — Ambulatory Visit
Admission: RE | Admit: 2023-01-24 | Discharge: 2023-01-24 | Disposition: A | Discharge status: Home / Self Care | Payer: Managed Care, Other (non HMO) | Source: Ambulatory Visit | Attending: Obstetrics and Gynecology | Admitting: Obstetrics and Gynecology

## 2023-01-24 ENCOUNTER — Ambulatory Visit
Admission: RE | Admit: 2023-01-24 | Discharge: 2023-01-24 | Disposition: A | Discharge status: Home / Self Care | Payer: BC Managed Care – PPO | Source: Ambulatory Visit | Attending: Obstetrics and Gynecology | Admitting: Obstetrics and Gynecology

## 2023-01-24 DIAGNOSIS — R928 Other abnormal and inconclusive findings on diagnostic imaging of breast: Secondary | ICD-10-CM

## 2023-01-31 ENCOUNTER — Encounter: Payer: Self-pay | Admitting: Family

## 2023-01-31 ENCOUNTER — Ambulatory Visit: Payer: BC Managed Care – PPO | Admitting: Family

## 2023-01-31 VITALS — BP 118/72 | HR 100 | Temp 97.7°F | Ht 65.0 in | Wt 198.0 lb

## 2023-01-31 DIAGNOSIS — Z23 Encounter for immunization: Secondary | ICD-10-CM

## 2023-01-31 DIAGNOSIS — E041 Nontoxic single thyroid nodule: Secondary | ICD-10-CM | POA: Insufficient documentation

## 2023-01-31 DIAGNOSIS — Z0000 Encounter for general adult medical examination without abnormal findings: Secondary | ICD-10-CM | POA: Diagnosis not present

## 2023-01-31 DIAGNOSIS — Z1159 Encounter for screening for other viral diseases: Secondary | ICD-10-CM | POA: Diagnosis not present

## 2023-01-31 DIAGNOSIS — L409 Psoriasis, unspecified: Secondary | ICD-10-CM | POA: Insufficient documentation

## 2023-01-31 DIAGNOSIS — Z13220 Encounter for screening for lipoid disorders: Secondary | ICD-10-CM

## 2023-01-31 LAB — CBC
HCT: 39.7 % (ref 36.0–46.0)
Hemoglobin: 13 g/dL (ref 12.0–15.0)
MCHC: 32.9 g/dL (ref 30.0–36.0)
MCV: 87 fl (ref 78.0–100.0)
Platelets: 284 10*3/uL (ref 150.0–400.0)
RBC: 4.56 Mil/uL (ref 3.87–5.11)
RDW: 13.1 % (ref 11.5–15.5)
WBC: 4.9 10*3/uL (ref 4.0–10.5)

## 2023-01-31 LAB — T4, FREE: Free T4: 0.89 ng/dL (ref 0.60–1.60)

## 2023-01-31 LAB — LIPID PANEL
Cholesterol: 193 mg/dL (ref 0–200)
HDL: 55.3 mg/dL (ref >39.00)
LDL Cholesterol: 120 mg/dL — ABNORMAL HIGH (ref 0–99)
NonHDL: 137.34
Total CHOL/HDL Ratio: 3
Triglycerides: 86 mg/dL (ref 0.0–149.0)
VLDL: 17.2 mg/dL (ref 0.0–40.0)

## 2023-01-31 LAB — BASIC METABOLIC PANEL
BUN: 10 mg/dL (ref 6–23)
CO2: 28 mEq/L (ref 19–32)
Calcium: 9.3 mg/dL (ref 8.4–10.5)
Chloride: 102 mEq/L (ref 96–112)
Creatinine, Ser: 0.83 mg/dL (ref 0.40–1.20)
GFR: 87.26 mL/min (ref >60.00)
Glucose, Bld: 99 mg/dL (ref 70–99)
Potassium: 3.9 mEq/L (ref 3.5–5.1)
Sodium: 136 mEq/L (ref 135–145)

## 2023-01-31 LAB — TSH: TSH: 0.8 u[IU]/mL (ref 0.35–5.50)

## 2023-01-31 LAB — T3, FREE: T3, Free: 3.7 pg/mL (ref 2.3–4.2)

## 2023-01-31 MED ORDER — CLOBETASOL PROPIONATE 0.05 % EX CREA: 1.0000 | TOPICAL_CREAM | Freq: Two times a day (BID) | CUTANEOUS | 0 refills | Status: AC

## 2023-01-31 MED ORDER — CLOBETASOL PROPIONATE 0.05 % EX SOLN: 1.0000 | Freq: Two times a day (BID) | CUTANEOUS | 0 refills | Status: DC

## 2023-01-31 NOTE — Assessment & Plan Note (Signed)
Thyroid ultrasound ordered  Thyroid panel ordered pending results.

## 2023-01-31 NOTE — Patient Instructions (Signed)
   Your imaging for thyroid ultrasound Has been ordered at the following location.  Please call to schedule a time and date that would work for you.   [address] [place] Phone [phone],  8-430 pm   -----------------------------------

## 2023-01-31 NOTE — Assessment & Plan Note (Signed)

## 2023-01-31 NOTE — Assessment & Plan Note (Signed)
Rx clobetasol solution apply twice daily x 10 days then twice weekly for maintenance Cream prn

## 2023-01-31 NOTE — Progress Notes (Signed)
New Patient Office Visit  Subjective:  Patient ID: Shari Farmer, female    DOB: [birth year]  Age: 42 y.o. MRN: [id number]  CC:  Chief Complaint  Patient presents with   Establish Care    Has not had PCP in very long time.    Annual Exam    HPI Shari Farmer is here to establish care as a new patient as well as here for annual exam Oriented to practice routines and expectations.  Prior provider was: has not had one for many years. Here to establish.  Pt is without acute concerns.   Tetanus vaccination: maybe back in 2014?  Pap smear: January 05, 2023   Regular diet  Exercise: restarted by joining the [employer] trying to go a few times a week Sleeps well  No living will or advanced directive  Utd on dental and eye exams No known h/o STDs    ROS: Negative unless specifically indicated above in HPI.   Current Outpatient Medications:    clobetasol (TEMOVATE) 0.05 % external solution, Apply 1 Application topically 2 (two) times daily., Disp: 50 mL, Rfl: 0   clobetasol cream (TEMOVATE) 0.05 %, Apply 1 Application topically 2 (two) times daily., Disp: 30 g, Rfl: 0   Emollient (COLLAGEN EX), Apply 1 Dose topically daily., Disp: , Rfl:    OVER THE COUNTER MEDICATION, Doterra Lifelong Vitality Pack, Disp: , Rfl:  Past Medical History:  Diagnosis Date   Medical history non-contributory    Thyroid nodule    Past Surgical History:  Procedure Laterality Date   BIOPSY THYROID Left    DENTAL SURGERY     implant   WISDOM TOOTH EXTRACTION      Objective:   Today's Vitals: BP 118/72   Pulse 100   Temp 97.7 F (36.5 C) (Temporal)   Ht 5\' 5"  (1.651 m)   Wt 198 lb (89.8 kg)   LMP 01/20/2023   SpO2 99%   BMI 32.95 kg/m   Physical Exam Constitutional:      General: She is not in acute distress.    Appearance: Normal appearance. She is obese. She is not ill-appearing.  HENT:     Head: Normocephalic.     Comments: Scalp with flaking raised and slightly erythematic lesions  on posterior scalp     Right Ear: Tympanic membrane normal.     Left Ear: Tympanic membrane normal.     Ears:     Comments: Dry scaly flaking skin in external ear canal right side     Nose: Nose normal.     Mouth/Throat:     Mouth: Mucous membranes are moist.  Eyes:     Extraocular Movements: Extraocular movements intact.     Pupils: Pupils are equal, round, and reactive to light.  Cardiovascular:     Rate and Rhythm: Normal rate and regular rhythm.  Pulmonary:     Effort: Pulmonary effort is normal.     Breath sounds: Normal breath sounds.  Abdominal:     General: Abdomen is flat. Bowel sounds are normal.     Palpations: Abdomen is soft.     Tenderness: There is no guarding or rebound.  Musculoskeletal:        General: Normal range of motion.     Cervical back: Normal range of motion.  Skin:    General: Skin is warm.     Capillary Refill: Capillary refill takes less than 2 seconds.  Neurological:     General: No focal deficit present.  Mental Status: She is alert.  Psychiatric:        Mood and Affect: Mood normal.        Behavior: Behavior normal.        Thought Content: Thought content normal.        Judgment: Judgment normal.     Assessment & Plan:  Left thyroid nodule Assessment & Plan: Thyroid ultrasound ordered  Thyroid panel ordered pending results.   Orders: -     US THYROID; Future -     Thyroid Peroxidase Antibodies (TPO) (REFL) -     T3, free -     T4, free -     TSH  Encounter for hepatitis C screening test for low risk patient -     Hepatitis C antibody  Encounter for general adult medical examination without abnormal findings Assessment & Plan: Patient Counseling(The following topics were reviewed):  Preventative care handout given to pt  Health maintenance and immunizations reviewed. Please refer to Health maintenance section. Pt advised on safe sex, wearing seatbelts in car, and proper nutrition labwork ordered today for annual Dental  health: Discussed importance of regular tooth brushing, flossing, and dental visits.   Orders: -     Lipid panel -     Basic metabolic panel -     CBC -     Hepatitis C antibody  Screening for lipoid disorders -     Lipid panel  Scalp psoriasis Assessment & Plan: Rx clobetasol solution apply twice daily x 10 days then twice weekly for maintenance Cream prn   Orders: -     Clobetasol Propionate; Apply 1 Application topically 2 (two) times daily.  Dispense: 50 mL; Refill: 0  Psoriasis -     Clobetasol Propionate; Apply 1 Application topically 2 (two) times daily.  Dispense: 30 g; Refill: 0  Other orders -     Tdap vaccine greater than or equal to 7yo IM    Follow-up: Return in about 1 year (around 01/31/2024) for f/u CPE.   Mort Sawyers, FNP

## 2023-02-02 ENCOUNTER — Ambulatory Visit
Admission: RE | Admit: 2023-02-02 | Discharge: 2023-02-02 | Disposition: A | Discharge status: Home / Self Care | Payer: BC Managed Care – PPO | Source: Ambulatory Visit | Attending: Family | Admitting: Family

## 2023-02-02 DIAGNOSIS — E041 Nontoxic single thyroid nodule: Secondary | ICD-10-CM

## 2023-02-03 ENCOUNTER — Telehealth: Payer: Self-pay | Admitting: Family

## 2023-02-03 NOTE — Telephone Encounter (Signed)
Patient returned call

## 2023-12-06 ENCOUNTER — Telehealth: Payer: Self-pay | Admitting: Family

## 2023-12-06 NOTE — Telephone Encounter (Signed)
 Patient dropped off document health assessment, to be filled out by provider. Patient requested to send it back via Call Patient to pick up within 5-days. Document is located in providers tray at front office.Please advise at Mobile [phone] (mobile)

## 2023-12-07 NOTE — Telephone Encounter (Signed)
 This has been completed and is in outbox

## 2023-12-07 NOTE — Telephone Encounter (Signed)
 Form has been placed up front for pick up. Pt is aware. Nothing further was needed at this time.

## 2024-03-15 ENCOUNTER — Ambulatory Visit: Payer: Self-pay | Admitting: Family

## 2024-03-15 ENCOUNTER — Encounter: Payer: Self-pay | Admitting: Family

## 2024-03-15 VITALS — BP 112/78 | HR 94 | Temp 98.7°F | Ht 65.0 in | Wt 195.0 lb

## 2024-03-15 DIAGNOSIS — E7800 Pure hypercholesterolemia, unspecified: Secondary | ICD-10-CM | POA: Diagnosis not present

## 2024-03-15 DIAGNOSIS — Z124 Encounter for screening for malignant neoplasm of cervix: Secondary | ICD-10-CM

## 2024-03-15 DIAGNOSIS — Z0000 Encounter for general adult medical examination without abnormal findings: Secondary | ICD-10-CM | POA: Diagnosis not present

## 2024-03-15 DIAGNOSIS — E041 Nontoxic single thyroid nodule: Secondary | ICD-10-CM | POA: Diagnosis not present

## 2024-03-15 DIAGNOSIS — M792 Neuralgia and neuritis, unspecified: Secondary | ICD-10-CM | POA: Diagnosis not present

## 2024-03-15 DIAGNOSIS — Z1283 Encounter for screening for malignant neoplasm of skin: Secondary | ICD-10-CM

## 2024-03-15 DIAGNOSIS — L409 Psoriasis, unspecified: Secondary | ICD-10-CM | POA: Diagnosis not present

## 2024-03-15 LAB — CBC
HCT: 39.3 % (ref 36.0–46.0)
Hemoglobin: 13.2 g/dL (ref 12.0–15.0)
MCHC: 33.6 g/dL (ref 30.0–36.0)
MCV: 85 fl (ref 78.0–100.0)
Platelets: 269 K/uL (ref 150.0–400.0)
RBC: 4.63 Mil/uL (ref 3.87–5.11)
RDW: 13 % (ref 11.5–15.5)
WBC: 5.1 K/uL (ref 4.0–10.5)

## 2024-03-15 LAB — BASIC METABOLIC PANEL WITH GFR
BUN: 18 mg/dL (ref 6–23)
CO2: 30 meq/L (ref 19–32)
Calcium: 9.9 mg/dL (ref 8.4–10.5)
Chloride: 99 meq/L (ref 96–112)
Creatinine, Ser: 0.8 mg/dL (ref 0.40–1.20)
GFR: 90.48 mL/min (ref >60.00)
Glucose, Bld: 93 mg/dL (ref 70–99)
Potassium: 4.1 meq/L (ref 3.5–5.1)
Sodium: 138 meq/L (ref 135–145)

## 2024-03-15 LAB — VITAMIN B12: Vitamin B-12: 311 pg/mL (ref 211–911)

## 2024-03-15 LAB — LIPID PANEL
Cholesterol: 194 mg/dL (ref 0–200)
HDL: 55.3 mg/dL (ref >39.00)
LDL Cholesterol: 123 mg/dL — ABNORMAL HIGH (ref 0–99)
NonHDL: 138.89
Total CHOL/HDL Ratio: 4
Triglycerides: 80 mg/dL (ref 0.0–149.0)
VLDL: 16 mg/dL (ref 0.0–40.0)

## 2024-03-15 LAB — TSH: TSH: 0.55 u[IU]/mL (ref 0.35–5.50)

## 2024-03-15 MED ORDER — CLOBETASOL PROPIONATE 0.05 % EX SOLN: 1.0000 | Freq: Two times a day (BID) | CUTANEOUS | 5 refills | Status: AC

## 2024-03-15 NOTE — Progress Notes (Signed)
 Subjective:  Patient ID: Shari Farmer, female    DOB: 1981-06-08  Age: 43 y.o. MRN: [id number]  Patient Care Team: Corwin Antu, FNP as PCP - General (Family Medicine) Okey Leader, MD as Consulting Physician (Obstetrics and Gynecology)   CC:  Chief Complaint  Patient presents with   Annual Exam    HPI Shari Farmer is a 43 y.o. female who presents today for an annual physical exam. She reports consuming a general diet. Working on a new routine going to a gym a few times a week She generally feels well. She reports sleeping well. She does not have additional problems to discuss today.   Vision:Within last year Dental:Receives regular dental care  Mammogram: 2025 negative  Last pap: 2025 with Dr. Okey   Discussed the use of AI scribe software for clinical note transcription with the patient, who gave verbal consent to proceed.  History of Present Illness Shari Farmer is a 43 year old female who presents for an annual physical exam.  She has a history of psoriasis affecting her scalp and ears. She uses a steroid cream and a solution for her scalp, and reports improvement when she uses them regularly. Her condition worsens at times but improves with consistent treatment. She also uses T-gel shampoo and feels that sun exposure helps, although she had less sun exposure this past summer due to work commitments.  She exercises regularly, although her routine has been disrupted since school started. She joined the [employer] and tries to stay active despite a busy schedule with her children's activities.  She experiences seasonal voice changes, becoming raspy when school starts, which she attributes to increased vocal use. She has a history of reflux and avoids certain foods. She takes an allergy pill.  She has noticed two skin spots that have not resolved and is considering seeing a dermatologist for evaluation. She has a history of a thyroid  nodule and reflux, which was evaluated by  an ENT in the past.  C/o right forearm pain will wake her up at night sometimes with the pain maybe how she is sleeping at times. Will go numb and start in fingers and go up her arm.  Only at night time.   Advanced Directives Patient does not have advanced directives      DEPRESSION SCREENING    03/15/2024   11:57 AM 01/31/2023    8:45 AM 03/03/2015    8:20 AM 02/04/2015    9:16 AM  PHQ 2/9 Scores  PHQ - 2 Score 0 0 0 0  PHQ- 9 Score 1 0       ROS: Negative unless specifically indicated above in HPI.    Current Outpatient Medications:    clobetasol  cream (TEMOVATE ) 0.05 %, Apply 1 Application topically 2 (two) times daily., Disp: 30 g, Rfl: 0   OVER THE COUNTER MEDICATION, Doterra Lifelong Vitality Pack, Disp: , Rfl:    clobetasol  (TEMOVATE ) 0.05 % external solution, Apply 1 Application topically 2 (two) times daily., Disp: 50 mL, Rfl: 5    Objective:    BP 112/78 (BP Location: Left Arm, Patient Position: Sitting)   Pulse 94   Temp 98.7 F (37.1 C) (Temporal)   Ht 5' 5 (1.651 m)   Wt 195 lb (88.5 kg)   LMP 03/05/2024 (Exact Date)   SpO2 97%   BMI 32.45 kg/m   BP Readings from Last 3 Encounters:  03/15/24 112/78  01/31/23 118/72  03/03/15 110/70      Physical Exam Vitals reviewed.  Constitutional:      General: She is not in acute distress.    Appearance: Normal appearance. She is normal weight. She is not ill-appearing.  HENT:     Head: Normocephalic.     Right Ear: Tympanic membrane normal.     Left Ear: Tympanic membrane normal.     Nose: Nose normal.     Right Turbinates: Swollen.     Left Turbinates: Swollen.     Mouth/Throat:     Mouth: Mucous membranes are moist.  Eyes:     Extraocular Movements: Extraocular movements intact.     Pupils: Pupils are equal, round, and reactive to light.  Cardiovascular:     Rate and Rhythm: Normal rate and regular rhythm.  Pulmonary:     Effort: Pulmonary effort is normal.     Breath sounds: Normal breath  sounds.  Abdominal:     General: Abdomen is flat. Bowel sounds are normal.     Palpations: Abdomen is soft.     Tenderness: There is no guarding or rebound.  Musculoskeletal:        General: Normal range of motion.     Cervical back: Normal range of motion.  Skin:    General: Skin is warm.     Capillary Refill: Capillary refill takes less than 2 seconds.     Comments: Multiple scattered nevi  One left sided upper arm medial side with raised skin pigmented lesion as well as similar on inner right breast not scaly   Neurological:     General: No focal deficit present.     Mental Status: She is alert.  Psychiatric:        Mood and Affect: Mood normal.        Behavior: Behavior normal.        Thought Content: Thought content normal.        Judgment: Judgment normal.         Results RADIOLOGY Mammogram: Normal (02/2024)  PATHOLOGY Pap smear: Normal (02/2024)      Assessment & Plan:   Assessment and Plan Assessment & Plan Adult Wellness Visit Routine adult wellness visit. She reports feeling well overall with no significant issues. She is up to date with mammograms and Pap smear, both normal. Recent dental and eye exams completed. Engages in regular exercise, though less frequent since school started. Declines flu vaccine. - Perform annual labs -Patient Counseling(The following topics were reviewed):  Preventative care handout given to pt  Health maintenance and immunizations reviewed. Please refer to Health maintenance section. Pt advised on safe sex, wearing seatbelts in car, and proper nutrition labwork ordered today for annual Dental health: Discussed importance of regular tooth brushing, flossing, and dental visits.   Scalp and other site psoriasis Chronic scalp psoriasis with intermittent flares. Improvement noted with regular use of prescribed topical solutions and T-gel shampoo. Condition worsens without regular treatment but improves with consistent use of  medication. No significant issues with psoriasis on other sites. - Refill prescription for psoriasis solution - Advise regular use of topical solution for two weeks, then maintenance twice weekly  Allergic rhinitis with nasal congestion Chronic allergic rhinitis with nasal congestion. Nasal examination reveals significant swelling. Currently uses an oral antihistamine but not Flonase. - Recommend adding Flonase to current antihistamine regimen  Suspected benign skin lesions Two skin lesions suspected to be benign. She expresses concern due to a similar case in a colleague that was cancerous. Lesions are not bothersome or itchy. - Recommend referral to [facility] for evaluation and possible removal of lesions   Right forearm wrist neuralgia; advised to wear a brace at night and see if this helps.  B12 ordered r/o        Follow-up: Return in about 1 year (around 03/15/2025) for f/u CPE.   Ginger Patrick, FNP

## 2024-03-18 ENCOUNTER — Ambulatory Visit: Payer: Self-pay | Admitting: Family

## 2024-04-02 ENCOUNTER — Encounter: Payer: Self-pay | Admitting: Family Medicine

## 2024-04-02 ENCOUNTER — Ambulatory Visit: Payer: Self-pay

## 2024-04-02 ENCOUNTER — Ambulatory Visit: Admitting: Family Medicine

## 2024-04-02 VITALS — BP 118/72 | HR 100 | Temp 98.1°F | Ht 65.0 in | Wt 197.0 lb

## 2024-04-02 DIAGNOSIS — R350 Frequency of micturition: Secondary | ICD-10-CM | POA: Diagnosis not present

## 2024-04-02 DIAGNOSIS — R0981 Nasal congestion: Secondary | ICD-10-CM | POA: Diagnosis not present

## 2024-04-02 LAB — POC URINALSYSI DIPSTICK (AUTOMATED)
Bilirubin, UA: NEGATIVE
Blood, UA: POSITIVE
Glucose, UA: NEGATIVE
Ketones, UA: NEGATIVE
Nitrite, UA: NEGATIVE
Protein, UA: NEGATIVE
Spec Grav, UA: <=1.005 — AB (ref 1.010–1.025)
Urobilinogen, UA: 0.2 U/dL
pH, UA: 6 (ref 5.0–8.0)

## 2024-04-02 MED ORDER — CEPHALEXIN 500 MG PO CAPS
500.0000 mg | ORAL_CAPSULE | Freq: Two times a day (BID) | ORAL | 0 refills | Status: AC
Start: 2024-04-02 — End: ?

## 2024-04-02 NOTE — Telephone Encounter (Signed)
 See OV  note

## 2024-04-02 NOTE — Assessment & Plan Note (Signed)
 Ctab, d/w pt about continued flonase use and restarting claritin.  Update us  as needed.

## 2024-04-02 NOTE — Telephone Encounter (Signed)
 FYI Only or Action Required?: FYI only for provider.  Patient was last seen in primary care on 03/15/2024 by Corwin Antu, FNP.  Called Nurse Triage reporting Dysuria and Urinary Frequency.  Symptoms began today.  Interventions attempted: Nothing.  Symptoms are: urinary frequency, burning/pain with urination stable.  Triage Disposition: See Physician Within 24 Hours  Patient/caregiver understands and will follow disposition?: Yes          Copied from CRM [id number]. Topic: Clinical - Red Word Triage >> Apr 02, 2024  9:23 AM Ivette P wrote: Red Word that prompted transfer to Nurse Triage: frequent and hurts when peeing and burning. Reason for Disposition  All other patients with painful urination  (Exception: [1] EITHER frequency or urgency AND [2] has on-call doctor.)  Answer Assessment - Initial Assessment Questions 1. SEVERITY: How bad is the pain?  (e.g., Scale 1-10; mild, moderate, or severe)     6-7/10.  2. FREQUENCY: How many times have you had painful urination today?      5-6 times.  3. PATTERN: Is pain present every time you urinate or just sometimes?      Every time.  4. ONSET: When did the painful urination start?      This morning.  5. FEVER: Do you have a fever? If Yes, ask: What is your temperature, how was it measured, and when did it start?     No.  6. PAST UTI: Have you had a urine infection before? If Yes, ask: When was the last time? and What happened that time?      Yes. Last one was a couple years ago.  7. CAUSE: What do you think is causing the painful urination?  (e.g., UTI, scratch, Herpes sore)     UTI.  8. OTHER SYMPTOMS: Do you have any other symptoms? (e.g., blood in urine, flank pain, genital sores, urgency, vaginal discharge)     Denies nausea, vomiting, back/flank pain, blood in urine.  9. PREGNANCY: Is there any chance you are pregnant? When was your last menstrual period?     OFE:[id number] on  period.  Protocols used: Urination Pain - Female-A-AH

## 2024-04-02 NOTE — Progress Notes (Signed)
 dysuria: burning, frequency.  Started today.  duration of symptoms: about 1 day.  This feels prev UTIs.   abdominal pain: no fevers:no back pain:no vomiting:no U/a d/w pt at OV.  Having cycle currently.   Recently with cough noted.  No fevers.  Voice is slightly hoarse.  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    Meds, vitals, and allergies reviewed.   Per HPI unless specifically indicated in ROS section   GEN: nad, alert and oriented HEENT: mucous membranes moist, TM wnl, nasal exam stuffy, clear rhinorrhea, OP w/o exudates.   NECK: supple CV: rrr.  PULM: ctab, no inc wob ABD: soft, +bs, suprapubic area not tender EXT: no edema SKIN: well perfused.  BACK: no CVA pain

## 2024-04-02 NOTE — Patient Instructions (Signed)
Drink plenty of water and start the antibiotics today.  We'll contact you with your lab report.  Take care.   

## 2024-04-02 NOTE — Assessment & Plan Note (Signed)
 Presumed cystitis.  Start keflex. Ucx pending.  Supportive care.  See AVS. D/w pt.  She agrees.

## 2024-04-03 NOTE — Telephone Encounter (Signed)
 NOTED

## 2024-04-04 ENCOUNTER — Ambulatory Visit: Payer: Self-pay | Admitting: Family Medicine

## 2024-04-05 LAB — URINE CULTURE
MICRO NUMBER:: 17090990
SPECIMEN QUALITY:: ADEQUATE

## 2024-10-17 ENCOUNTER — Ambulatory Visit: Admitting: Dermatology

## 2025-03-18 ENCOUNTER — Encounter: Admitting: Family
